# Patient Record
Sex: Female | Born: 1940 | Race: White | Hispanic: No | Marital: Married | Smoking: Never smoker
Health system: Southern US, Community
[De-identification: ages and names within clinical notes are randomized; demographics above are authoritative.]

## PROBLEM LIST (undated history)

## (undated) DIAGNOSIS — E785 Hyperlipidemia, unspecified: Secondary | ICD-10-CM

## (undated) DIAGNOSIS — I10 Essential (primary) hypertension: Secondary | ICD-10-CM

## (undated) DIAGNOSIS — E669 Obesity, unspecified: Secondary | ICD-10-CM

## (undated) DIAGNOSIS — E1169 Type 2 diabetes mellitus with other specified complication: Secondary | ICD-10-CM

## (undated) DIAGNOSIS — Z6841 Body Mass Index (BMI) 40.0 and over, adult: Secondary | ICD-10-CM

## (undated) DIAGNOSIS — E039 Hypothyroidism, unspecified: Secondary | ICD-10-CM

## (undated) DIAGNOSIS — K760 Fatty (change of) liver, not elsewhere classified: Secondary | ICD-10-CM

## (undated) DIAGNOSIS — M3500 Sicca syndrome, unspecified: Secondary | ICD-10-CM

## (undated) DIAGNOSIS — G629 Polyneuropathy, unspecified: Secondary | ICD-10-CM

## (undated) DIAGNOSIS — M722 Plantar fascial fibromatosis: Secondary | ICD-10-CM

## (undated) HISTORY — DX: Hyperlipidemia, unspecified: E78.5

## (undated) HISTORY — DX: Sjogren syndrome, unspecified: M35.00

## (undated) HISTORY — PX: OTHER SURGICAL HISTORY: SHX169

## (undated) HISTORY — DX: Polyneuropathy, unspecified: G62.9

## (undated) HISTORY — DX: Type 2 diabetes mellitus with other specified complication: E11.69

## (undated) HISTORY — DX: Plantar fascial fibromatosis: M72.2

## (undated) HISTORY — DX: Hypothyroidism, unspecified: E03.9

## (undated) HISTORY — DX: Morbid (severe) obesity due to excess calories: E66.01

## (undated) HISTORY — DX: Obesity, unspecified: E66.9

## (undated) HISTORY — DX: Essential (primary) hypertension: I10

## (undated) HISTORY — PX: ABDOMINAL HYSTERECTOMY: SHX81

## (undated) HISTORY — DX: Body Mass Index (BMI) 40.0 and over, adult: Z684

---

## 1998-03-20 ENCOUNTER — Ambulatory Visit (HOSPITAL_COMMUNITY): Admission: RE | Admit: 1998-03-20 | Discharge: 1998-03-20 | Payer: Self-pay | Admitting: Cardiology

## 1998-03-30 ENCOUNTER — Ambulatory Visit (HOSPITAL_COMMUNITY): Admission: RE | Admit: 1998-03-30 | Discharge: 1998-03-30 | Payer: Self-pay | Admitting: Cardiology

## 1999-05-11 ENCOUNTER — Ambulatory Visit (HOSPITAL_COMMUNITY): Admission: RE | Admit: 1999-05-11 | Discharge: 1999-05-11 | Payer: Self-pay | Admitting: Gastroenterology

## 2000-05-21 ENCOUNTER — Emergency Department (HOSPITAL_COMMUNITY): Admission: EM | Admit: 2000-05-21 | Discharge: 2000-05-21 | Payer: Self-pay | Admitting: Emergency Medicine

## 2000-05-21 ENCOUNTER — Encounter: Payer: Self-pay | Admitting: *Deleted

## 2000-06-12 ENCOUNTER — Encounter: Admission: RE | Admit: 2000-06-12 | Discharge: 2000-06-12 | Payer: Self-pay | Admitting: Gastroenterology

## 2000-06-12 ENCOUNTER — Encounter: Payer: Self-pay | Admitting: Gastroenterology

## 2000-07-25 ENCOUNTER — Encounter: Payer: Self-pay | Admitting: Gastroenterology

## 2000-07-25 ENCOUNTER — Ambulatory Visit (HOSPITAL_COMMUNITY): Admission: RE | Admit: 2000-07-25 | Discharge: 2000-07-25 | Payer: Self-pay | Admitting: Gastroenterology

## 2000-08-02 ENCOUNTER — Other Ambulatory Visit: Admission: RE | Admit: 2000-08-02 | Discharge: 2000-08-02 | Payer: Self-pay | Admitting: *Deleted

## 2000-08-15 ENCOUNTER — Emergency Department (HOSPITAL_COMMUNITY): Admission: EM | Admit: 2000-08-15 | Discharge: 2000-08-15 | Payer: Self-pay

## 2000-10-03 ENCOUNTER — Ambulatory Visit (HOSPITAL_COMMUNITY): Admission: RE | Admit: 2000-10-03 | Discharge: 2000-10-03 | Payer: Self-pay | Admitting: Gastroenterology

## 2000-10-03 ENCOUNTER — Encounter (INDEPENDENT_AMBULATORY_CARE_PROVIDER_SITE_OTHER): Payer: Self-pay | Admitting: Specialist

## 2001-05-02 ENCOUNTER — Encounter: Payer: Self-pay | Admitting: Endocrinology

## 2001-05-02 ENCOUNTER — Ambulatory Visit (HOSPITAL_COMMUNITY): Admission: RE | Admit: 2001-05-02 | Discharge: 2001-05-02 | Payer: Self-pay | Admitting: Endocrinology

## 2001-10-16 ENCOUNTER — Ambulatory Visit (HOSPITAL_COMMUNITY): Admission: RE | Admit: 2001-10-16 | Discharge: 2001-10-16 | Payer: Self-pay | Admitting: Endocrinology

## 2001-10-16 ENCOUNTER — Encounter: Payer: Self-pay | Admitting: Endocrinology

## 2002-10-25 ENCOUNTER — Encounter: Payer: Self-pay | Admitting: Obstetrics and Gynecology

## 2002-10-25 ENCOUNTER — Encounter: Admission: RE | Admit: 2002-10-25 | Discharge: 2002-10-25 | Payer: Self-pay | Admitting: Obstetrics and Gynecology

## 2003-03-04 ENCOUNTER — Ambulatory Visit (HOSPITAL_COMMUNITY): Admission: RE | Admit: 2003-03-04 | Discharge: 2003-03-04 | Payer: Self-pay | Admitting: Endocrinology

## 2003-03-04 ENCOUNTER — Encounter: Payer: Self-pay | Admitting: Endocrinology

## 2004-02-11 ENCOUNTER — Encounter (INDEPENDENT_AMBULATORY_CARE_PROVIDER_SITE_OTHER): Payer: Self-pay | Admitting: Specialist

## 2004-02-11 ENCOUNTER — Ambulatory Visit (HOSPITAL_COMMUNITY): Admission: RE | Admit: 2004-02-11 | Discharge: 2004-02-11 | Payer: Self-pay | Admitting: Gastroenterology

## 2004-06-30 ENCOUNTER — Encounter: Admission: RE | Admit: 2004-06-30 | Discharge: 2004-06-30 | Payer: Self-pay | Admitting: Surgery

## 2005-10-12 ENCOUNTER — Encounter: Payer: Self-pay | Admitting: Cardiology

## 2007-06-13 ENCOUNTER — Encounter: Admission: RE | Admit: 2007-06-13 | Discharge: 2007-06-13 | Payer: Self-pay | Admitting: Obstetrics & Gynecology

## 2009-11-11 ENCOUNTER — Encounter: Admission: RE | Admit: 2009-11-11 | Discharge: 2009-11-11 | Payer: Self-pay | Admitting: Obstetrics and Gynecology

## 2010-01-04 ENCOUNTER — Encounter: Admission: RE | Admit: 2010-01-04 | Discharge: 2010-03-11 | Payer: Self-pay | Admitting: Endocrinology

## 2010-07-04 ENCOUNTER — Encounter: Payer: Self-pay | Admitting: Obstetrics and Gynecology

## 2010-09-15 ENCOUNTER — Ambulatory Visit
Admission: RE | Admit: 2010-09-15 | Discharge: 2010-09-15 | Disposition: A | Discharge status: Home / Self Care | Payer: Medicare Other | Source: Ambulatory Visit | Attending: Internal Medicine | Admitting: Internal Medicine

## 2010-09-15 ENCOUNTER — Other Ambulatory Visit: Payer: Self-pay | Admitting: Internal Medicine

## 2010-09-15 DIAGNOSIS — R600 Localized edema: Secondary | ICD-10-CM

## 2010-10-29 NOTE — Op Note (Signed)
NAME:  Rebecca Carey, Rebecca Carey                      ACCOUNT NO.:  [id number]   MEDICAL RECORD NO.:  [id number]                   PATIENT TYPE:  AMB   LOCATION:  ENDO                                 FACILITY:  [facility]   PHYSICIAN:  John C. Madilyn Fireman, M.D.                 DATE OF BIRTH:  1941-01-23   DATE OF PROCEDURE:  02/11/2004  DATE OF DISCHARGE:                                 OPERATIVE REPORT   PROCEDURE:  Colonoscopy.   INDICATION FOR PROCEDURE:  Family history of colon polyps with last  colonoscopy in 2000.  She has also had a three-month history of diarrhea and  occasional rectal bleeding.   PROCEDURE:  The patient was placed in the left lateral decubitus position  and placed on the pulse monitor with continuous low-flow oxygen delivered  via nasal cannula.  She was sedated with 62.5 mcg IV fentanyl and 8 mg IV  Versed.  The Olympus video colonoscope was inserted into the rectum and  advanced to the cecum, confirmed by transillumination of McBurney's point  and visualization of the ileocecal valve and appendiceal orifice.  The prep  was excellent.  The terminal ileum was intubated, explored for several  centimeters, and appeared to be within normal limits.  The cecum, ascending,  transverse, descending, and sigmoid colon all appeared normal with no  masses, polyps, diverticula, or other mucosal abnormalities.  The rectum  likewise appeared normal with the exception of some small to moderate  internal hemorrhoids seen on retroflexed view.  Biopsies were taken to rule  out microscopic colitis.  The scope was then withdrawn and the patient  returned to the recovery room in stable condition.  She tolerated the  procedure well, and there were no immediate complications.   IMPRESSION:  Internal hemorrhoids, otherwise normal study.   PLAN:  Await biopsy results in search of the cause of her diarrhea.  Will  consider serologic screening for celiac disease if biopsies negative  Follow-  up  colonoscopy in five years.                                               John C. Madilyn Fireman, M.D.    JCH/MEDQ  D:  02/11/2004  T:  02/12/2004  Job:  [id number]   cc:   Jeannett Senior A. Evlyn Kanner, M.D.  [address]  [place]  [place] [address]  Fax: [phone]

## 2010-11-02 NOTE — Op Note (Signed)
[place]  Patient:    Rebecca Carey, Rebecca Carey                   MRN: [id number] Proc. Date: 10/03/00 Adm. Date:  [id number] Attending:  Louie Bun CC:         Anselm Pancoast. Zachery Dakins, M.D.   Operative Report  PROCEDURE:  Esophagogastroduodenoscopy with biopsy.  INDICATIONS:  Noncardiac chest and epigastric abdominal pain with failure to respond adequately to Nexium and minimal response to Levbid.  The patient has requested consultation with surgeon for possible biliary etiology, although, her Pipida scan and ultrasound have been negative.  DESCRIPTION OF PROCEDURE:  The patient was placed in the left lateral decubitus position and placed on the pulse monitor with continuous low flow oxygen delivered by nasal cannula.  She was sedated with 50 mg of IV Demerol and 5 mg of IV Versed.  The olympus video endoscope was advanced under direct vision into the oropharynx and esophagus.  The esophagus was straight and of normal caliber with squamocolumnar line at 36 cm.  The GE junction was quite patulous and free reflux was noted during the procedure.  There appeared to be a short 1 to 2 cm hiatal hernia and when the LES was fully relaxed, a widely patent ring was seen.  In addition, there was a small granular-appearing nodule 1 or 2 cm distal to the squamocolumnar line.  This was biopsied, but accurate biopsing was difficult due to the patients inability to hold air and some wretching during the procedure.  Initial biopsies resulted in bleeding which obscured the view and may have limited the accuracy of the biopsies. The stomach was entered and a small amount of liquid secretions were suctioned from the fundus.  Retroflexed view of the cardia was unremarkable.  The fundus, body, antrum, and pyloris all appeared normal.  The duodenum was entered and both the bulb and second portions were well inspected and appeared to be within normal limits.   Endoscope was then withdrawn and the patient returned to the recovery room in stable condition.  She tolerated the procedure well and there were no immediate complications.  IMPRESSION: 1. Hiatal hernia with patulous gastroesophageal junction and evidence of    gastroesophageal reflux. 2. Widely patent lower esophageal ring. 3. Small gastroesophageal junction nodule, biopsied.  PLAN:  Await biopsy results and will reinitiate Nexium at b.i.d. dosing while continuing Levbid.  Follow up in the office in a few weeks to assess response and to review biopsy results. DD:  10/03/00 TD:  10/04/00 Job: [id number]

## 2011-05-27 ENCOUNTER — Other Ambulatory Visit: Payer: Self-pay | Admitting: Nurse Practitioner

## 2011-05-27 DIAGNOSIS — Z1231 Encounter for screening mammogram for malignant neoplasm of breast: Secondary | ICD-10-CM

## 2011-06-29 ENCOUNTER — Ambulatory Visit
Admission: RE | Admit: 2011-06-29 | Discharge: 2011-06-29 | Disposition: A | Discharge status: Home / Self Care | Payer: Medicare Other | Source: Ambulatory Visit | Attending: Nurse Practitioner | Admitting: Nurse Practitioner

## 2011-06-29 DIAGNOSIS — Z1231 Encounter for screening mammogram for malignant neoplasm of breast: Secondary | ICD-10-CM

## 2012-09-14 ENCOUNTER — Other Ambulatory Visit (HOSPITAL_COMMUNITY): Payer: Self-pay | Admitting: Cardiology

## 2012-09-14 DIAGNOSIS — R0602 Shortness of breath: Secondary | ICD-10-CM

## 2012-09-14 DIAGNOSIS — R079 Chest pain, unspecified: Secondary | ICD-10-CM

## 2012-09-20 ENCOUNTER — Ambulatory Visit (HOSPITAL_COMMUNITY)
Admission: RE | Admit: 2012-09-20 | Discharge: 2012-09-20 | Disposition: A | Discharge status: Home / Self Care | Payer: Medicare Other | Source: Ambulatory Visit | Attending: Cardiology | Admitting: Cardiology

## 2012-09-20 ENCOUNTER — Encounter (HOSPITAL_COMMUNITY): Payer: Medicare Other

## 2012-09-20 DIAGNOSIS — R0989 Other specified symptoms and signs involving the circulatory and respiratory systems: Secondary | ICD-10-CM | POA: Insufficient documentation

## 2012-09-20 DIAGNOSIS — R0609 Other forms of dyspnea: Secondary | ICD-10-CM | POA: Insufficient documentation

## 2012-09-20 DIAGNOSIS — R0602 Shortness of breath: Secondary | ICD-10-CM

## 2012-09-20 DIAGNOSIS — I10 Essential (primary) hypertension: Secondary | ICD-10-CM | POA: Insufficient documentation

## 2012-09-20 DIAGNOSIS — E119 Type 2 diabetes mellitus without complications: Secondary | ICD-10-CM | POA: Insufficient documentation

## 2012-09-20 DIAGNOSIS — E669 Obesity, unspecified: Secondary | ICD-10-CM | POA: Insufficient documentation

## 2012-09-20 DIAGNOSIS — R079 Chest pain, unspecified: Secondary | ICD-10-CM

## 2012-09-20 DIAGNOSIS — I252 Old myocardial infarction: Secondary | ICD-10-CM | POA: Insufficient documentation

## 2012-09-20 HISTORY — PX: TRANSTHORACIC ECHOCARDIOGRAM: SHX275

## 2012-09-20 NOTE — Progress Notes (Signed)
[facility]   2D echo completed 09/20/2012.   Cindy Jemmie Rhinehart, RDCS  

## 2012-09-21 ENCOUNTER — Encounter (HOSPITAL_COMMUNITY): Payer: Medicare Other

## 2012-09-21 ENCOUNTER — Ambulatory Visit (HOSPITAL_COMMUNITY): Payer: Medicare Other

## 2012-10-23 ENCOUNTER — Other Ambulatory Visit: Payer: Self-pay | Admitting: Internal Medicine

## 2012-10-23 DIAGNOSIS — Z1231 Encounter for screening mammogram for malignant neoplasm of breast: Secondary | ICD-10-CM

## 2012-11-06 ENCOUNTER — Ambulatory Visit (HOSPITAL_COMMUNITY)
Admission: RE | Admit: 2012-11-06 | Discharge: 2012-11-06 | Disposition: A | Discharge status: Home / Self Care | Payer: Medicare Other | Source: Ambulatory Visit | Attending: Cardiology | Admitting: Cardiology

## 2012-11-06 DIAGNOSIS — R0609 Other forms of dyspnea: Secondary | ICD-10-CM | POA: Insufficient documentation

## 2012-11-06 DIAGNOSIS — R5381 Other malaise: Secondary | ICD-10-CM | POA: Insufficient documentation

## 2012-11-06 DIAGNOSIS — R42 Dizziness and giddiness: Secondary | ICD-10-CM | POA: Insufficient documentation

## 2012-11-06 DIAGNOSIS — R5383 Other fatigue: Secondary | ICD-10-CM | POA: Insufficient documentation

## 2012-11-06 DIAGNOSIS — R0989 Other specified symptoms and signs involving the circulatory and respiratory systems: Secondary | ICD-10-CM | POA: Insufficient documentation

## 2012-11-06 DIAGNOSIS — R079 Chest pain, unspecified: Secondary | ICD-10-CM | POA: Insufficient documentation

## 2012-11-06 DIAGNOSIS — R002 Palpitations: Secondary | ICD-10-CM | POA: Insufficient documentation

## 2012-11-06 DIAGNOSIS — R0602 Shortness of breath: Secondary | ICD-10-CM | POA: Insufficient documentation

## 2012-11-06 HISTORY — PX: NM MYOVIEW LTD: HXRAD82

## 2012-11-06 MED ORDER — REGADENOSON 0.4 MG/5ML IV SOLN
0.4000 mg | Freq: Once | INTRAVENOUS | Status: AC
  Administered 2012-11-06: 0.4 mg via INTRAVENOUS

## 2012-11-06 MED ORDER — TECHNETIUM TC 99M SESTAMIBI GENERIC - CARDIOLITE
30.0000 | Freq: Once | INTRAVENOUS | Status: AC | PRN
  Administered 2012-11-06: 30 via INTRAVENOUS

## 2012-11-06 MED ORDER — TECHNETIUM TC 99M SESTAMIBI GENERIC - CARDIOLITE
10.0000 | Freq: Once | INTRAVENOUS | Status: AC | PRN
  Administered 2012-11-06: 10 via INTRAVENOUS

## 2012-11-06 MED ORDER — AMINOPHYLLINE 25 MG/ML IV SOLN
75.0000 mg | Freq: Once | INTRAVENOUS | Status: AC
  Administered 2012-11-06: 75 mg via INTRAVENOUS

## 2012-11-06 NOTE — Procedures (Addendum)
[facility] [address] [place] [address] [place] [address] [phone]  Cardiology Nuclear Med Study  Rebecca Carey is a 72 y.o. female     MRN : [id number]     DOB: 03-08-1941  Procedure Date: 11/06/2012  Nuclear Med Background Indication for Stress Test:  Evaluation for Ischemia History:  ptca--unknown year Cardiac Risk Factors: Family History - CAD, Hypertension, Lipids, NIDDM and Obesity  Symptoms:  Chest Pain, Dizziness, Fatigue, Light-Headedness, Palpitations and SOB   Nuclear Pre-Procedure Caffeine/Decaff Intake:  10:00pm NPO After: 8:00am   IV Site: R Hand  IV 0.9% NS with Angio Cath:  22g  Chest Size (in):  N/A IV Started by: Emmit Pomfret, RN  Height: 5\' 2"  (1.575 m)  Cup Size: n/a  BMI:  Body mass index is 44.25 kg/(m^2). Weight:  242 lb (109.77 kg)   Tech Comments:  N/A    Nuclear Med Study 1 or 2 day study: 1 day  Stress Test Type:  Lexiscan  Order Authorizing Provider:  Bryan Lemma, MD   Resting Radionuclide: Technetium 32m Sestamibi  Resting Radionuclide Dose: 10.2 mCi   Stress Radionuclide:  Technetium 52m Sestamibi  Stress Radionuclide Dose: 31.0 mCi           Stress Protocol Rest HR: 58 Stress HR: 81  Rest BP: 139/77 Stress BP: 139/99  Exercise Time (min): n/a METS: n/a   Predicted Max HR: 151 bpm % Max HR: 53.64 bpm Rate Pressure Product: 21308  Dose of Adenosine (mg):  n/a Dose of Lexiscan: 0.4 mg  Dose of Atropine (mg): n/a Dose of Dobutamine: n/a mcg/kg/min (at max HR)  Stress Test Technologist: Esperanza Sheets, CCT Nuclear Technologist: Koren Shiver, CNMT   Rest Procedure:  Myocardial perfusion imaging was performed at rest 45 minutes following the intravenous administration of Technetium 22m Sestamibi. Stress Procedure:  The patient received IV Lexiscan 0.4 mg over 15-seconds.  Technetium 49m Sestamibi injected at 30-seconds.  The Patient experienced SOB, stomach, arms and head  discomfort; 75 mg of IV Aminophylline was administered with resolution of symptoms.  There were no significant changes with Lexiscan.  Quantitative spect images were obtained after a 45 minute delay.  Transient Ischemic Dilatation (Normal <1.22):  0.97 Lung/Heart Ratio (Normal <0.45):  0.39 QGS EDV:  60 ml QGS ESV:  18 ml LV Ejection Fraction: 71%  Signed by Koren Shiver, CNMT  PHYSICIAN INTERPRETATION  Rest ECG: NSR - Normal EKG  Stress ECG: No significant change from baseline ECG and No significant ST segment change suggestive of ischemia.  QPS Raw Data Images:  There is a breast shadow that accounts for the anterior attenuation. Stress Images:  There is decreased uptake in the apex.  There is decreased uptake in the anterior wall. Rest Images:  There is decreased uptake in the apex. There is decreased uptake in the anterior wall.Comparison with the stress images reveals no significant change. Subtraction (SDS):  There is a fixed anteriour defect that is most consistent with breast attenuation.  Impression Exercise Capacity:  Lexiscan with no exercise. BP Response:  Hypotensive blood pressure response. Clinical Symptoms:  There is dyspnea. ECG Impression:  No significant ECG changes with Lexiscan. Comparison with Prior Nuclear Study: No images to compare LV Wall Motion:  NL LV Function; NL Wall Motion  Overall Impression:  Normal stress nuclear study. and Low risk stress nuclear study with apical perfusion defect consistent with Breast Attenuation.Marland Kitchen   Marykay Lex, MD  11/06/2012 5:55 PM

## 2012-11-09 ENCOUNTER — Telehealth: Payer: Self-pay | Admitting: *Deleted

## 2012-11-09 NOTE — Telephone Encounter (Signed)
Message copied by Tobin Chad on Fri Nov 09, 2012  6:31 PM ------      Message from: [facility], DAVID      Created: Wed Nov 07, 2012  1:25 PM       Essentially normal nuclear stress test. No evidence of any heart artery blockages or prior heart attack. Normal function.            Marykay Lex, MD       ------

## 2012-11-09 NOTE — Telephone Encounter (Signed)
Spoke to patient . Results given about  Her Myoview. She stated that she has chest discomfort most of the day. Center her chest thorough to her back.She states she has had som type of discomfort since Feb 2014.  She states she has only took aspirin  for discomfort . Instruction given to patient to go to ER for distress. She states she has an appointment 6/24. Do not wait for appt if you have discomfort go to ER.

## 2012-11-27 ENCOUNTER — Ambulatory Visit
Admission: RE | Admit: 2012-11-27 | Discharge: 2012-11-27 | Disposition: A | Discharge status: Home / Self Care | Payer: Medicare Other | Source: Ambulatory Visit | Attending: Internal Medicine | Admitting: Internal Medicine

## 2012-11-27 DIAGNOSIS — Z1231 Encounter for screening mammogram for malignant neoplasm of breast: Secondary | ICD-10-CM

## 2012-12-04 ENCOUNTER — Ambulatory Visit (INDEPENDENT_AMBULATORY_CARE_PROVIDER_SITE_OTHER): Payer: Medicare Other | Admitting: Cardiology

## 2012-12-04 ENCOUNTER — Encounter: Payer: Self-pay | Admitting: Cardiology

## 2012-12-04 VITALS — BP 150/80 | HR 68 | Ht 62.0 in | Wt 237.7 lb

## 2012-12-04 DIAGNOSIS — E669 Obesity, unspecified: Secondary | ICD-10-CM

## 2012-12-04 DIAGNOSIS — E785 Hyperlipidemia, unspecified: Secondary | ICD-10-CM

## 2012-12-04 DIAGNOSIS — Z79899 Other long term (current) drug therapy: Secondary | ICD-10-CM

## 2012-12-04 DIAGNOSIS — R0789 Other chest pain: Secondary | ICD-10-CM

## 2012-12-04 DIAGNOSIS — E039 Hypothyroidism, unspecified: Secondary | ICD-10-CM

## 2012-12-04 DIAGNOSIS — Z6841 Body Mass Index (BMI) 40.0 and over, adult: Secondary | ICD-10-CM

## 2012-12-04 DIAGNOSIS — E1169 Type 2 diabetes mellitus with other specified complication: Secondary | ICD-10-CM

## 2012-12-04 DIAGNOSIS — I10 Essential (primary) hypertension: Secondary | ICD-10-CM

## 2012-12-04 DIAGNOSIS — E119 Type 2 diabetes mellitus without complications: Secondary | ICD-10-CM

## 2012-12-04 DIAGNOSIS — R600 Localized edema: Secondary | ICD-10-CM

## 2012-12-04 DIAGNOSIS — R609 Edema, unspecified: Secondary | ICD-10-CM

## 2012-12-04 MED ORDER — FUROSEMIDE 20 MG PO TABS: 20.0000 mg | ORAL_TABLET | Freq: Every day | ORAL | Status: DC

## 2012-12-04 MED ORDER — LOSARTAN POTASSIUM 50 MG PO TABS: 50.0000 mg | ORAL_TABLET | Freq: Every day | ORAL | Status: DC

## 2012-12-04 NOTE — Patient Instructions (Addendum)
Your physician has recommended you make the following change in your medication:  Start taking Losartan (Cozaar) 50mg  daily.  Complete HCTZ bottle, then begin taking Furosemide 20mg  daily.   Your physician recommends that you return for lab work in: 2 months BMET  Your physician wants you to follow-up in: 3 months. You will receive a reminder letter in the mail two months in advance. If you don't receive a letter, please call our office to schedule the follow-up appointment.

## 2012-12-09 ENCOUNTER — Encounter: Payer: Self-pay | Admitting: Cardiology

## 2012-12-09 DIAGNOSIS — E1169 Type 2 diabetes mellitus with other specified complication: Secondary | ICD-10-CM | POA: Insufficient documentation

## 2012-12-09 DIAGNOSIS — I10 Essential (primary) hypertension: Secondary | ICD-10-CM | POA: Insufficient documentation

## 2012-12-09 DIAGNOSIS — R600 Localized edema: Secondary | ICD-10-CM | POA: Insufficient documentation

## 2012-12-09 DIAGNOSIS — E039 Hypothyroidism, unspecified: Secondary | ICD-10-CM | POA: Insufficient documentation

## 2012-12-09 DIAGNOSIS — E785 Hyperlipidemia, unspecified: Secondary | ICD-10-CM | POA: Insufficient documentation

## 2012-12-09 NOTE — Progress Notes (Signed)
Patient ID: Rebecca Carey, female   DOB: 06-05-1941, 72 y.o.   MRN: [id number]  Clinic Note: HPI: Rebecca Carey is a 72 y.o. female with a PMH below who presents today for followup of her LexiScan Myoview. I saw the second and may with an atypical chest pain as well as dizziness and lightheadedness of palpitations. This is in the setting of a lot of cysts of social stressors. She had mild edema nothing significant. Her symptoms were evaluated with a LexiScan Myoview was negative for ischemia. He also had an echocardiogram to confirm normal ejection fraction, no regional wall motion around his. I also started her on losartan 25 mg daily and put her back on HCTZ. The main reason for missing her was more edema and poorly controlled blood pressure..  Interval History: Since his last visit he says he is overall feeling better in the chest he still has lightheadedness and this feels like his calf on. He mostly notices the palpitations when he has a higher stress levels. She does walk herself daily but walking her dog. She's lost 5 pounds since her last visit. He still has a little discomfort in his chest 27 the palpitations but otherwise not really significant discomfort. She denies any true dyspnea on exertion, unless she is really pushing it.  She still notes palpitations, as well as mild bilateral edema. She notes her chest discomfort has improved, she still notes some symptoms of hot flashes as well as lightheadedness.. She sleeps with the a wedge which would be like 3 pillow orthopnea. She still has mild edema bilaterally.  Overall for cardiac standpoint, she still does note some intermittent palpitations along with baseline shortness of breath. She denies any further chest discomfort but does have mild lightheadedness, tinnitus, and bloating. Overall, provided she didn't reduce her level of stress of this dictation ON this. Her blood rate controlled be much improved.   The remainder of  cardiac review of systems is as follows: Cardiovascular ROS: positive for - dyspnea on exertion and shortness of breath negative for - edema, irregular heartbeat, loss of consciousness, murmur, orthopnea or paroxysmal nocturnal dyspnea :   Past Medical History  Diagnosis Date  . Morbid obesity with BMI of 45.0-49.9, adult     BMI ~45; 5'2" 242 lb  . HTN (hypertension)   . 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 Dyslipidemia   . Hypothyroidism    Prior Cardiac Evaluation and Past Surgical History: Past Surgical History  Procedure Laterality Date  . Doppler echocardiography  09/20/2012    normal LV size and function with an EF of    Allergies  Allergen Reactions  . Codeine Other (See Comments)    unspecified  . Cortisone Other (See Comments)    Unspecified.   . Sulfa Antibiotics Other (See Comments)    Unspecified.     Current Outpatient Prescriptions  Medication Sig Dispense Refill  . ALPHA LIPOIC ACID PO Take 1 tablet by mouth 2 (two) times daily.      . Ascorbic Acid (VITAMIN C PO) Take 1 tablet by mouth daily.      . B Complex Vitamins (B COMPLEX PO) Place 1 tablet under the tongue daily.      Marland Kitchen BIOTIN PO Take 1 tablet by mouth daily.      . CHROMIUM PO Take 1 tablet by mouth daily.      . Coenzyme Q10 (CO Q-10) 100 MG CAPS Take  100 mg by mouth 2 (two) times daily.      Forestine Na Leaf POWD Take 1 capsule by mouth daily.      . hydrochlorothiazide (MICROZIDE) 12.5 MG capsule Take 12.5 mg by mouth daily.      . Multiple Vitamins-Minerals (MULTIVITAMIN PO) Take 1 tablet by mouth daily.      . Omega-3 Fatty Acids (FISH OIL PO) Take 1 capsule by mouth daily.      Marland Kitchen OVER THE COUNTER MEDICATION Take 1 each by mouth at bedtime. CAL-MAX (calcium-magnesium-vit C)      . propranolol (INDERAL) 20 MG tablet Take 20 mg by mouth 2 (two) times daily.      Marland Kitchen SYNTHROID 125 MCG tablet Take 125 mcg by mouth daily.      Marland Kitchen VANADYL SULFATE PO Take 1  tablet by mouth 2 (two) times daily.      . furosemide (LASIX) 20 MG tablet Take 1 tablet (20 mg total) by mouth daily.  30 tablet  11  . losartan (COZAAR) 50 MG tablet Take 1 tablet (50 mg total) by mouth daily.  30 tablet  11   No current facility-administered medications for this visit.    History   Social History  . Marital Status: Married    Spouse Name: N/A    Number of Children: N/A  . Years of Education: N/A   Occupational History  . Not on file.   Social History Main Topics  . Smoking status: Never Smoker   . Smokeless tobacco: Never Used  . Alcohol Use: No  . Drug Use: No  . Sexually Active: Not on file   Other Topics Concern  . Not on file   Social History Narrative   She is a married mother of 1. She tries to walk every day, does not smoke and does not drink.      She has a significant family history coronary disease.    ROS: A comprehensive Review of Systems - Negative except Pertinent positives and negatives above  PHYSICAL EXAM BP 150/80  Pulse 68  Ht 5\' 2"  (1.575 m)  Wt 237 lb 11.2 oz (107.82 kg)  BMI 43.46 kg/m2 General: She is a very pleasant, very nervous appearing, morbidly obese woman. She is in no acute distress, alert and oriented x3, answers questions appropriately. She is well-groomed. HEENT: NCAT, EOMI, MMM and anicteric sclerae.  Neck: Supple, no LAN, JVD or carotid bruit. Normal carotid upstrokes.  Lungs: CTAB, nonlabored, normal effort, good air movement.  Heart: RRR, normal S1, S2, no M/R/G. Nondisplaced PMI.  Abdomen: Obese but otherwise soft/NT/ND/NABS. No HSM.  Extremities: No C/C/E, 2+ and equal pulses throughout. Mild puffiness, ~1+ nonpitting edema in the lower extremities and ankles and elbows.  UJW:[id number] today: No   Your physician wants you to follow-up in: 3   ASSESSMENT:  Overall improved. She's had a pretty good cardiac evaluation does show that her symptoms are probably not cardiac in nature.  Patient Active  Problem List   Diagnosis Date Noted  . Morbid obesity with BMI of 45.0-49.9, adult     Priority: High  . HTN (hypertension)     Priority: Medium  . Dyslipidemia     Priority: Medium  . Diabetes mellitus type 2 in obese     Priority: Medium  . Hypothyroidism     Priority: Low  . Edema of both legs 12/09/2012     PLAN:Start Taking Losartan (Cozaar) 50 Mg Daily; Complete HCTZ Bottle, Then Begin  Taking Furosemide 20 Mg Daily.  Your Physician Recommends That You Return to work  This coming Monday. With the medication change, we will check Lab Work in: 2 Months (BMET)   Per problem list. Orders Placed This Encounter  Procedures  . Basic Metabolic Panel (BMET)    2 months  Followup: 3 months;  Waseem Suess W, M.D., M.S. [facility] [address] [place], [place]  [address]  [phone] Pager # [phone] 12/09/2012 10:03 PM

## 2012-12-11 DIAGNOSIS — R079 Chest pain, unspecified: Secondary | ICD-10-CM | POA: Insufficient documentation

## 2012-12-11 NOTE — Assessment & Plan Note (Addendum)
Overall her symptoms seem to be improved as far as chest discomfort lightheadedness and palpitations. She was relieved to here that her stress test was normal. Overall seemed to have improved. As it is associated with palpitations, next follow be to potentially titrate up her Inderal as her beta blocker. A portion is she's had issues with this in the past.

## 2012-12-11 NOTE — Assessment & Plan Note (Signed)
I believe this is being followed by her primary physician.

## 2012-12-11 NOTE — Assessment & Plan Note (Signed)
When she gets her next lab check we'll do surveillance he is.

## 2012-12-11 NOTE — Assessment & Plan Note (Signed)
She lost some weight since her last visit. She still needs to continue to lose weight. We discussed the importance of dietary modification as well as exercise.

## 2012-12-11 NOTE — Assessment & Plan Note (Addendum)
Not as adequately controlled as expected. She is on beta blocker, albeit Inderal as well as an ARB. As the Inderal is kind of standing medicine, not inclined to adjust to much. Continue to follow. May require adjustment of her beta blocker dose.  Since edema is become an issue for her, we'll then switch her from HCTZ to Lasix. She'll continue HCTZ the next couple days and then will switch over to Lasix. Also can increase her Cozaar to 50 mg daily. Will recheck labs in a couple months and see her back after that. In roughly 3 months.

## 2012-12-11 NOTE — Assessment & Plan Note (Signed)
Switched from HCTZ to Lasix. She'll continue next couple days of the HCTZ then stop it and restart her on 20 mg of Lasix daily.

## 2013-01-30 LAB — BASIC METABOLIC PANEL
BUN: 13 mg/dL (ref 6–23)
Creat: 0.57 mg/dL (ref 0.50–1.10)
Glucose, Bld: 102 mg/dL — ABNORMAL HIGH (ref 70–99)
Potassium: 4.4 mEq/L (ref 3.5–5.3)

## 2013-01-31 ENCOUNTER — Telehealth: Payer: Self-pay | Admitting: *Deleted

## 2013-01-31 NOTE — Telephone Encounter (Signed)
Message copied by Tobin Chad on Thu Jan 31, 2013  4:19 PM ------      Message from: [facility], DAVID      Created: Wed Jan 30, 2013 10:24 PM       Labs essentially looked pretty good but the glucose level is in the borderline zone for pre-diabetes. We'll need to monitor this. ------

## 2013-01-31 NOTE — Telephone Encounter (Signed)
Result given. Verbalized understanding.

## 2013-02-27 ENCOUNTER — Encounter: Payer: Self-pay | Admitting: *Deleted

## 2013-03-08 ENCOUNTER — Encounter: Payer: Self-pay | Admitting: Cardiology

## 2013-03-08 ENCOUNTER — Ambulatory Visit (INDEPENDENT_AMBULATORY_CARE_PROVIDER_SITE_OTHER): Payer: Medicare Other | Admitting: Cardiology

## 2013-03-08 VITALS — BP 130/92 | HR 62 | Ht 62.0 in | Wt 240.1 lb

## 2013-03-08 DIAGNOSIS — I10 Essential (primary) hypertension: Secondary | ICD-10-CM

## 2013-03-08 DIAGNOSIS — R609 Edema, unspecified: Secondary | ICD-10-CM

## 2013-03-08 DIAGNOSIS — Z6841 Body Mass Index (BMI) 40.0 and over, adult: Secondary | ICD-10-CM

## 2013-03-08 DIAGNOSIS — E669 Obesity, unspecified: Secondary | ICD-10-CM

## 2013-03-08 DIAGNOSIS — E1169 Type 2 diabetes mellitus with other specified complication: Secondary | ICD-10-CM

## 2013-03-08 DIAGNOSIS — R600 Localized edema: Secondary | ICD-10-CM

## 2013-03-08 DIAGNOSIS — I119 Hypertensive heart disease without heart failure: Secondary | ICD-10-CM

## 2013-03-08 DIAGNOSIS — E119 Type 2 diabetes mellitus without complications: Secondary | ICD-10-CM

## 2013-03-08 DIAGNOSIS — E785 Hyperlipidemia, unspecified: Secondary | ICD-10-CM

## 2013-03-08 DIAGNOSIS — R0789 Other chest pain: Secondary | ICD-10-CM

## 2013-03-08 MED ORDER — LOSARTAN POTASSIUM 25 MG PO TABS: 25.0000 mg | ORAL_TABLET | Freq: Every day | ORAL | Status: DC

## 2013-03-08 NOTE — Patient Instructions (Addendum)
I am glad things are doing better overall. Since you're still having with her that discomfort, would start by trying to get you back to Cozaar.  First try doing it by splitting the dose in half taking 1 in the morning and one in the evening.  With the Lasix, you don't have thickened every day if you can be out all afternoon don't take it.  I'm not certain what the tinnitus is coming from, I would recommend seeing an ENT doctor.  Marykay Lex, MD   Your physician wants you to follow-up in: 6 You will receive a reminder letter in the mail two months in advance. If you don't receive a letter, please call our office to schedule the follow-up appointment.

## 2013-03-13 ENCOUNTER — Encounter: Payer: Self-pay | Admitting: Cardiology

## 2013-03-13 NOTE — Assessment & Plan Note (Signed)
Her chest discomfort seems to improve.  A PICC is maybe more related to palpitations.  Thankfully her stress test was normal as was her echocardiogram.  With the increased dose of Inderal, her discomfort has gotten much better.

## 2013-03-13 NOTE — Assessment & Plan Note (Signed)
Much better on Lasix.  Palpation he states every day.  She does take it more like several days a week and as needed.

## 2013-03-13 NOTE — Assessment & Plan Note (Signed)
Followed by PCP.  I also don't know that she is on a medication other than the omega-3 fatty acids for treatment.  As far as her report they were relatively normal.  Her goal LDL the probably less than 1:30.

## 2013-03-13 NOTE — Progress Notes (Signed)
For a  Patient ID: Rebecca Carey, female   DOB: 06-16-1940, 72 y.o.   MRN: [id number]  Clinic Note: HPI: [name] is a 72 y.o. female with a PMH below who presents today for three-month followup.  Over the summer she has had chest pain evaluated with a LexiScan Myoview that demonstrated breast attenuation, she also had echocardiogram that was essentially normal.  I last saw her, my plan was to increase her losartan to 50 mg daily and switched from HCTZ to Lasix.  She was unable to tolerate the increased dose of losartan to 50.  She said that she felt a little dizzy.  She does not have edema is improved with the Lasix.  Interval History: As was the case in the last visit, she stills says that she feels "better each day.  The palpitations are better the dyspnea is better chest aching is also better. He is following along but it is still there.  The stress level is also a little bit decreased. She notes some mild arm puffiness.  The lightheadedness are off and on present.  The palpitations are notably improved, as a result her chest discomfort is also improved.    She denies any true dyspnea on exertion, unless she is really pushing it.  She still notes mild bilateral edema -- but more so in the hands. She still notes some symptoms of hot flashes as well as lightheadedness.. She sleeps with the a wedge which would be like 3 pillow orthopnea.   The remainder of cardiac review of systems is as follows: Cardiovascular ROS: positive for - dyspnea on exertion and palpitations negative for - chest pain, edema, irregular heartbeat, loss of consciousness, murmur, orthopnea, paroxysmal nocturnal dyspnea or rapid heart rate :  Past Medical History  Diagnosis Date  . Morbid obesity with BMI of 45.0-49.9, adult     BMI ~45; 5'2" 242 lb  . HTN (hypertension)   . 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 Dyslipidemia   . Hypothyroidism    Prior Cardiac Evaluation and  Past Surgical History: Past Surgical History  Procedure Laterality Date  . Doppler echocardiography  09/20/2012    normal LV size and function with an EF of  . Nm myoview ltd  11/06/2012    EF 71%.  No ischemia or infarction.  Anterior defect consistent with breast attenuation    Allergies  Allergen Reactions  . Codeine Other (See Comments)    unspecified  . Cortisone Other (See Comments)    Unspecified.   . Sulfa Antibiotics Other (See Comments)    Unspecified.     Current Outpatient Prescriptions  Medication Sig Dispense Refill  . ALPHA LIPOIC ACID PO Take 1 tablet by mouth 2 (two) times daily.      . Ascorbic Acid (VITAMIN C PO) Take 1 tablet by mouth daily.      . B Complex Vitamins (B COMPLEX PO) Place 1 tablet under the tongue daily.      Marland Kitchen BIOTIN PO Take 1 tablet by mouth daily.      . CHROMIUM PO Take 1 tablet by mouth daily.      . Coenzyme Q10 (CO Q-10) 100 MG CAPS Take 100 mg by mouth 2 (two) times daily.      . furosemide (LASIX) 20 MG tablet Take 1 tablet (20 mg total) by mouth daily.  30 tablet  11  . Gymnema Sylvestris Leaf POWD Take 1 capsule  by mouth daily.      Marland Kitchen losartan (COZAAR) 25 MG tablet Take 1 tablet (25 mg total) by mouth daily.  50 tablet  6  . Multiple Vitamins-Minerals (MULTIVITAMIN PO) Take 1 tablet by mouth daily.      . Omega-3 Fatty Acids (FISH OIL PO) Take 1 capsule by mouth daily.      . ONE TOUCH ULTRA TEST test strip       . OVER THE COUNTER MEDICATION Take 1 each by mouth at bedtime. CAL-MAX (calcium-magnesium-vit C)      . propranolol (INDERAL) 20 MG tablet Take 20 mg by mouth 2 (two) times daily.      Marland Kitchen SYNTHROID 125 MCG tablet Take 125 mcg by mouth daily.      Marland Kitchen VANADYL SULFATE PO Take 1 tablet by mouth 2 (two) times daily.       No current facility-administered medications for this visit.   History   Social History Narrative   She is a married mother of 1. She tries to walk every day, does not smoke and does not drink.      She  has a significant family history coronary disease.   ROS: A comprehensive Review of Systems - Negative except Pertinent positives and negatives above  PHYSICAL EXAM BP 130/92  Pulse 62  Ht 5\' 2"  (1.575 m)  Wt 240 lb 1.6 oz (108.909 kg)  BMI 43.9 kg/m2 General: She is a very pleasant, very nervous appearing, morbidly obese woman. NAD, A&Ox3. She is well-groomed. HEENT: NCAT, EOMI, MMM and anicteric sclerae.  Neck: Supple, no LAN, JVD or carotid bruit. Normal carotid upstrokes.  Lungs: CTAB, nonlabored, normal effort, good air movement.  Heart: RRR, normal S1, S2, no M/R/G. Nondisplaced PMI.  Abdomen: Obese but otherwise soft/NT/ND/NABS. No HSM.  Extremities: No C/C/E, 2+ and equal pulses throughout. Mild puffiness, ~1+ nonpitting edema in the lower extremities and ankles and elbows.  ZOX:WRUEAVWUJ today: Yes; Heart Rate 62 beats that appear normally see  BMP August 2014: Essentially normal, potassium 4.4, sodium 136, BUN/creatinine 13/0.57, glucose 102  ASSESSMENT/PLAN  Overall continued improvement. She's had a pretty good cardiac evaluation does show that her symptoms are probably not cardiac in nature.  Recommend that she sees her ENT physician to evaluate her tinnitus.  Atypical chest pain, low risk of acute coronary syndrome. Her chest discomfort seems to improve.  A PICC is maybe more related to palpitations.  Thankfully her stress test was normal as was her echocardiogram.  With the increased dose of Inderal, her discomfort has gotten much better.  HTN (hypertension) Diastolic pressure still being high, I want to get a little more afterload reduction just in case there is something diastolic dysfunction associated with her symptoms.  Plan: Gradually increased to a total of 50 mg of losartan.  We may need to do sports gradually.  We'll increase to 25 twice a day first.  If he tolerates that then we may go to 50 mg once a day, to keep things easier.  Edema of both legs Much  better on Lasix.  Palpation he states every day.  She does take it more like several days a week and as needed.  Dyslipidemia Followed by PCP.  I also don't know that she is on a medication other than the omega-3 fatty acids for treatment.  As far as her report they were relatively normal.  Her goal LDL the probably less than 1:30.  Morbid obesity with BMI of 45.0-49.9, adult No real  weight loss.  Again I stressed the importance of dietary modification and exercise.  We talked about decreased fatty foods and carbohydrates.  Improved fruits and vegetables smaller portions of lean proteins.    Orders Placed This Encounter  Procedures  . EKG 12-Lead   Followup: 6 months;  Deyonna Fitzsimmons W, M.D., M.S. [facility] [address] [place], [place]  [address]  [phone] Pager # [phone] 03/13/2013 5:00 AM

## 2013-03-13 NOTE — Assessment & Plan Note (Signed)
Diastolic pressure still being high, I want to get a little more afterload reduction just in case there is something diastolic dysfunction associated with her symptoms.  Plan: Gradually increased to a total of 50 mg of losartan.  We may need to do sports gradually.  We'll increase to 25 twice a day first.  If he tolerates that then we may go to 50 mg once a day, to keep things easier.

## 2013-03-13 NOTE — Assessment & Plan Note (Signed)
No real weight loss.  Again I stressed the importance of dietary modification and exercise.  We talked about decreased fatty foods and carbohydrates.  Improved fruits and vegetables smaller portions of lean proteins.

## 2013-04-02 ENCOUNTER — Encounter: Payer: Self-pay | Admitting: Cardiology

## 2013-09-26 ENCOUNTER — Ambulatory Visit (INDEPENDENT_AMBULATORY_CARE_PROVIDER_SITE_OTHER): Payer: Medicare Other | Admitting: Cardiology

## 2013-09-26 VITALS — BP 148/90 | HR 68 | Ht 62.0 in | Wt 240.1 lb

## 2013-09-26 DIAGNOSIS — R079 Chest pain, unspecified: Secondary | ICD-10-CM

## 2013-09-26 DIAGNOSIS — E669 Obesity, unspecified: Secondary | ICD-10-CM

## 2013-09-26 DIAGNOSIS — E785 Hyperlipidemia, unspecified: Secondary | ICD-10-CM

## 2013-09-26 DIAGNOSIS — E119 Type 2 diabetes mellitus without complications: Secondary | ICD-10-CM

## 2013-09-26 DIAGNOSIS — E1169 Type 2 diabetes mellitus with other specified complication: Secondary | ICD-10-CM

## 2013-09-26 DIAGNOSIS — R600 Localized edema: Secondary | ICD-10-CM

## 2013-09-26 DIAGNOSIS — Z6841 Body Mass Index (BMI) 40.0 and over, adult: Secondary | ICD-10-CM

## 2013-09-26 DIAGNOSIS — R609 Edema, unspecified: Secondary | ICD-10-CM

## 2013-09-26 DIAGNOSIS — I10 Essential (primary) hypertension: Secondary | ICD-10-CM

## 2013-09-26 MED ORDER — ISOSORBIDE MONONITRATE ER 30 MG PO TB24
30.0000 mg | ORAL_TABLET | Freq: Every day | ORAL | Status: DC
Start: 2013-09-26 — End: 2016-03-16

## 2013-09-26 NOTE — Patient Instructions (Signed)
Your physician recommends that you schedule a follow-up appointment in: 6 months  Your physician has recommended you make the following change in your medication: Start Imdur(isosorbide 30 mg at bedtime also start Aspirin 81 mg, take Imdur 30 minutes after taking the 81 mg Aspirin

## 2013-09-29 ENCOUNTER — Encounter: Payer: Self-pay | Admitting: Cardiology

## 2013-09-29 NOTE — Assessment & Plan Note (Signed)
Dual LDL would be closer to 100, but less than 130. Not currently on medication beyond omega-3 fatty acids and CoQ 10

## 2013-09-29 NOTE — Assessment & Plan Note (Signed)
Her negative Myoview in argue against macrovascular disease, however cannot exclude microvascular disease in the setting of mild LVH. Add low-dose nitrate to decrease preload and afterload and potentially help microvascular ischemia. We'll also add low-dose aspirin.

## 2013-09-29 NOTE — Assessment & Plan Note (Signed)
Interestingly, she still remains 1 hypertensive but was unable to tolerate increased dose of Cozaar. We may have to simply tolerate mild to his hypertension. I am Imdur, but that should not a silly affect her blood pressure. She is also on Inderal which we could potentially consider increasing in the future, if heart rate would allow it.

## 2013-09-29 NOTE — Assessment & Plan Note (Signed)
Better on Lasix 

## 2013-09-29 NOTE — Progress Notes (Signed)
Patient ID: Emmit AlexandersDorothy S Craigo, female   DOB: 12/10/1940, 73 y.o.   MRN: [id number]  Clinic Note: HPI: Emmit AlexandersDorothy S Defreitas is a 73 y.o. female with a PMH below who presents today for a two-month followup I last saw her in October for followup of a nonischemic LexiScan Myoview that was used to evaluate chest discomfort and palpitations.. She asked they seem to be symptomatically improved and was happy to you results of her stress test. We did increase the dose of Inderal for palpitations as well as hypertension. She was not able tolerate the losartan increased. Stable edema on Lasix  Interval History: As is usually the case, she has ups and downs. She says she is doing "about the same she says she's been having a bit more chest discomfort over the past few weeks than before, but has also been under quite a bit of social stress.  She says he currently has discomfort that starts in her back and shoulder blades and radiates around the chest. She also occasionally has some palpitations are much more rare than they have been before. The chest discomfort is not associated with any particular activity, in fact she does not notice it with the walking that she been trying to do. She has baseline exertional dyspnea that has not really changed. The more she has been walking, the less she gets short of breath, which would suggest that this is related to deconditioning.  She was not able tolerate increased dose of losartan, so she is currently only taking 12.5mg . She is also a bit discouraged that she has not lost any weight with the level activity she is doing. She has however noted get her glucose under much better control with diet and exercise.  The remainder of cardiac review of systems is as follows: Cardiovascular ROS: positive for - dyspnea on exertion and shortness of breath negative for - edema, irregular heartbeat, loss of consciousness, murmur, orthopnea or paroxysmal nocturnal dyspnea; no TIA or  amaurosis fugax symptoms; no syncope/near-syncope; no melena, hematochezia, hematuria or epistaxis. No claudication  Past Medical History  Diagnosis Date  . Morbid obesity with BMI of 45.0-49.9, adult     BMI ~45; 5'2" 242 lb  . HTN (hypertension)   . 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 Dyslipidemia   . Hypothyroidism    Prior Cardiac Evaluation and Past Surgical History: Past Surgical History  Procedure Laterality Date  . Doppler echocardiography  09/20/2012    normal LV size and function with an EF of  . Nm myoview ltd  11/06/2012    EF 71%.  No ischemia or infarction.  Anterior defect consistent with breast attenuation   MEDICATIONS AND ALLERGIES reviewed in Epic; no change Social and Family History also reviewed in Epic. No change  ROS: A comprehensive Review of Systems - Negative except Pertinent positives and negatives above  PHYSICAL EXAM BP 148/90  Pulse 68  Ht 5\' 2"  (1.575 m)  Wt 240 lb 1.6 oz (108.909 kg)  BMI 43.90 kg/m2 General: She is a very pleasant, very nervous appearing, morbidly obese woman. She is in no acute distress, alert and oriented x3, answers questions appropriately. She is well-groomed. HEENT: NCAT, EOMI, MMM and anicteric sclerae.  Neck: Supple, no LAN, JVD or carotid bruit. Normal carotid upstrokes.  Lungs: CTAB, nonlabored, normal effort, good air movement.  Heart: RRR, normal S1, S2, no M/R/G. Nondisplaced PMI.  Abdomen: Obese but otherwise soft/NT/ND/NABS. No  HSM.  Extremities: No C/C/E, 2+ and equal pulses throughout. Mild puffiness, ~1+ nonpitting edema in the lower extremities and ankles and elbows.  ZOX:WRUEAVWUJEKG:Performed today: y Normal sinus rhythm, rate 68. Normal axis and intervals. Essentially normal EKG  ASSESSMENT:  Overall improved. She's had a pretty good cardiac evaluation does show that her symptoms are probably not cardiac in nature.  Chest pain with low risk for cardiac etiology Her negative Myoview in  argue against macrovascular disease, however cannot exclude microvascular disease in the setting of mild LVH. Add low-dose nitrate to decrease preload and afterload and potentially help microvascular ischemia. We'll also add low-dose aspirin.  Dyslipidemia Dual LDL would be closer to 100, but less than 130. Not currently on medication beyond omega-3 fatty acids and CoQ 10  Edema of both legs Better on Lasix.  Morbid obesity with BMI of 45.0-49.9, adult Despite or size, not really losing weight. Will likely probably need dietary modification. Continue to encourage her dietary changes and exercise. I would hope that eventually weight will come down. If she continues to have difficulty, would potentially consider referral for GOP.  HTN (hypertension) Interestingly, she still remains 1 hypertensive but was unable to tolerate increased dose of Cozaar. We may have to simply tolerate mild to his hypertension. I am Imdur, but that should not a silly affect her blood pressure. She is also on Inderal which we could potentially consider increasing in the future, if heart rate would allow it.    PLAN SUMMARY:  Add Imdur 30 mg daily for possible microvascular disease  Start each bedtime aspirin that she takes prior to Imdur  Per problem list. Orders Placed This Encounter  Procedures  . EKG 12-Lead    Followup: 6 months;  Marykay Lexavid W Harding, M.D., M.S. [facility] [address] [place], [place]  [id number]  [phone] Pager # [phone] 09/29/2013 5:40 PM

## 2013-09-29 NOTE — Assessment & Plan Note (Signed)
Despite or size, not really losing weight. Will likely probably need dietary modification. Continue to encourage her dietary changes and exercise. I would hope that eventually weight will come down. If she continues to have difficulty, would potentially consider referral for GOP.

## 2013-10-24 ENCOUNTER — Other Ambulatory Visit: Payer: Self-pay | Admitting: Internal Medicine

## 2013-10-24 DIAGNOSIS — R1011 Right upper quadrant pain: Secondary | ICD-10-CM

## 2013-10-29 ENCOUNTER — Ambulatory Visit
Admission: RE | Admit: 2013-10-29 | Discharge: 2013-10-29 | Disposition: A | Discharge status: Home / Self Care | Payer: Medicare Other | Source: Ambulatory Visit | Attending: Internal Medicine | Admitting: Internal Medicine

## 2013-10-29 DIAGNOSIS — R1011 Right upper quadrant pain: Secondary | ICD-10-CM

## 2013-12-02 ENCOUNTER — Other Ambulatory Visit: Payer: Self-pay

## 2013-12-02 MED ORDER — FUROSEMIDE 20 MG PO TABS: 20.0000 mg | ORAL_TABLET | Freq: Every day | ORAL | Status: DC

## 2013-12-02 NOTE — Telephone Encounter (Signed)
Rx was sent to pharmacy electronically. 

## 2013-12-18 ENCOUNTER — Other Ambulatory Visit: Payer: Self-pay | Admitting: Dermatology

## 2014-02-03 ENCOUNTER — Other Ambulatory Visit: Payer: Self-pay

## 2014-02-03 MED ORDER — LOSARTAN POTASSIUM 25 MG PO TABS: 25.0000 mg | ORAL_TABLET | Freq: Every day | ORAL | Status: DC

## 2014-02-03 NOTE — Telephone Encounter (Signed)
Rx was sent to pharmacy electronically. 

## 2014-02-03 NOTE — Telephone Encounter (Signed)
Spoke with patient to clarify dosage,  Patient takes  a day had been getting  tablets and cutting them in half. Patient also had questions about colonoscopy coming up in on 02/18/14. I spoke with Dr.Harding who said that after viewing last office note that he has no concerns with her going in for procedure.

## 2014-02-19 ENCOUNTER — Other Ambulatory Visit: Payer: Self-pay

## 2014-02-19 MED ORDER — FUROSEMIDE 20 MG PO TABS: 20.0000 mg | ORAL_TABLET | Freq: Every day | ORAL | Status: DC

## 2014-02-19 MED ORDER — LOSARTAN POTASSIUM 25 MG PO TABS: 25.0000 mg | ORAL_TABLET | Freq: Every day | ORAL | Status: DC

## 2014-02-19 NOTE — Telephone Encounter (Signed)
Rx was sent to pharmacy electronically. 

## 2014-02-24 ENCOUNTER — Other Ambulatory Visit (HOSPITAL_COMMUNITY): Payer: Self-pay | Admitting: Gastroenterology

## 2014-02-24 ENCOUNTER — Other Ambulatory Visit: Payer: Self-pay | Admitting: Physician Assistant

## 2014-02-24 DIAGNOSIS — R140 Abdominal distension (gaseous): Secondary | ICD-10-CM

## 2014-03-03 ENCOUNTER — Ambulatory Visit (HOSPITAL_COMMUNITY)
Admission: RE | Admit: 2014-03-03 | Discharge: 2014-03-03 | Disposition: A | Discharge status: Home / Self Care | Payer: Medicare Other | Source: Ambulatory Visit | Attending: Gastroenterology | Admitting: Gastroenterology

## 2014-03-03 DIAGNOSIS — R141 Gas pain: Secondary | ICD-10-CM | POA: Diagnosis not present

## 2014-03-03 DIAGNOSIS — R142 Eructation: Secondary | ICD-10-CM

## 2014-03-03 DIAGNOSIS — R109 Unspecified abdominal pain: Secondary | ICD-10-CM | POA: Diagnosis not present

## 2014-03-03 DIAGNOSIS — R143 Flatulence: Secondary | ICD-10-CM

## 2014-03-03 DIAGNOSIS — R140 Abdominal distension (gaseous): Secondary | ICD-10-CM

## 2014-03-03 MED ORDER — SINCALIDE 5 MCG IJ SOLR
0.0200 ug/kg | Freq: Once | INTRAMUSCULAR | Status: AC
  Administered 2014-03-03: 2.18 ug via INTRAVENOUS

## 2014-03-03 MED ORDER — TECHNETIUM TC 99M MEBROFENIN IV KIT
5.0000 | PACK | Freq: Once | INTRAVENOUS | Status: AC | PRN
  Administered 2014-03-03: 5 via INTRAVENOUS

## 2014-03-03 MED ORDER — SINCALIDE 5 MCG IJ SOLR
INTRAMUSCULAR | Status: AC
Start: 2014-03-03 — End: 2014-03-03
  Administered 2014-03-03: 2.18 ug via INTRAVENOUS
  Filled 2014-03-03: qty 10

## 2014-04-08 ENCOUNTER — Other Ambulatory Visit: Payer: Self-pay | Admitting: Gastroenterology

## 2014-07-13 ENCOUNTER — Encounter (HOSPITAL_COMMUNITY): Payer: Self-pay | Admitting: Emergency Medicine

## 2014-07-13 ENCOUNTER — Emergency Department (HOSPITAL_COMMUNITY)
Admission: EM | Admit: 2014-07-13 | Discharge: 2014-07-13 | Disposition: A | Discharge status: Home / Self Care | Payer: Medicare Other | Attending: Emergency Medicine | Admitting: Emergency Medicine

## 2014-07-13 ENCOUNTER — Emergency Department (HOSPITAL_COMMUNITY): Payer: Medicare Other

## 2014-07-13 DIAGNOSIS — I10 Essential (primary) hypertension: Secondary | ICD-10-CM | POA: Insufficient documentation

## 2014-07-13 DIAGNOSIS — R1011 Right upper quadrant pain: Secondary | ICD-10-CM

## 2014-07-13 DIAGNOSIS — E119 Type 2 diabetes mellitus without complications: Secondary | ICD-10-CM | POA: Insufficient documentation

## 2014-07-13 DIAGNOSIS — G8929 Other chronic pain: Secondary | ICD-10-CM | POA: Insufficient documentation

## 2014-07-13 DIAGNOSIS — E785 Hyperlipidemia, unspecified: Secondary | ICD-10-CM | POA: Insufficient documentation

## 2014-07-13 DIAGNOSIS — F419 Anxiety disorder, unspecified: Secondary | ICD-10-CM | POA: Diagnosis not present

## 2014-07-13 DIAGNOSIS — E039 Hypothyroidism, unspecified: Secondary | ICD-10-CM | POA: Insufficient documentation

## 2014-07-13 DIAGNOSIS — Z79899 Other long term (current) drug therapy: Secondary | ICD-10-CM | POA: Insufficient documentation

## 2014-07-13 DIAGNOSIS — R109 Unspecified abdominal pain: Secondary | ICD-10-CM

## 2014-07-13 HISTORY — DX: Fatty (change of) liver, not elsewhere classified: K76.0

## 2014-07-13 LAB — CBC
HCT: 40.7 % (ref 36.0–46.0)
Hemoglobin: 13.5 g/dL (ref 12.0–15.0)
MCH: 29 pg (ref 26.0–34.0)
MCHC: 33.2 g/dL (ref 30.0–36.0)
MCV: 87.3 fL (ref 78.0–100.0)
Platelets: 295 10*3/uL (ref 150–400)
RBC: 4.66 MIL/uL (ref 3.87–5.11)
RDW: 12.8 % (ref 11.5–15.5)
WBC: 5.6 10*3/uL (ref 4.0–10.5)

## 2014-07-13 LAB — COMPREHENSIVE METABOLIC PANEL
ALK PHOS: 66 U/L (ref 39–117)
ALT: 20 U/L (ref 0–35)
AST: 22 U/L (ref 0–37)
Albumin: 3.6 g/dL (ref 3.5–5.2)
Anion gap: 8 (ref 5–15)
BILIRUBIN TOTAL: 0.8 mg/dL (ref 0.3–1.2)
BUN: 7 mg/dL (ref 6–23)
CO2: 25 mmol/L (ref 19–32)
CREATININE: 0.44 mg/dL — AB (ref 0.50–1.10)
Calcium: 9.2 mg/dL (ref 8.4–10.5)
Chloride: 105 mmol/L (ref 96–112)
GFR calc Af Amer: >90 mL/min (ref >90)
GFR calc non Af Amer: >90 mL/min (ref >90)
GLUCOSE: 112 mg/dL — AB (ref 70–99)
Potassium: 3.8 mmol/L (ref 3.5–5.1)
Sodium: 138 mmol/L (ref 135–145)
TOTAL PROTEIN: 6.7 g/dL (ref 6.0–8.3)

## 2014-07-13 LAB — URINALYSIS, ROUTINE W REFLEX MICROSCOPIC
Bilirubin Urine: NEGATIVE
GLUCOSE, UA: NEGATIVE mg/dL
Hgb urine dipstick: NEGATIVE
Ketones, ur: NEGATIVE mg/dL
LEUKOCYTES UA: NEGATIVE
NITRITE: NEGATIVE
Protein, ur: NEGATIVE mg/dL
Specific Gravity, Urine: 1.018 (ref 1.005–1.030)
UROBILINOGEN UA: 0.2 mg/dL (ref 0.0–1.0)
pH: 7.5 (ref 5.0–8.0)

## 2014-07-13 LAB — LIPASE, BLOOD: Lipase: 28 U/L (ref 11–59)

## 2014-07-13 MED ORDER — SODIUM CHLORIDE 0.9 % IV BOLUS (SEPSIS)
500.0000 mL | Freq: Once | INTRAVENOUS | Status: AC
  Administered 2014-07-13: 500 mL via INTRAVENOUS

## 2014-07-13 MED ORDER — HYDROMORPHONE HCL 1 MG/ML IJ SOLN
1.0000 mg | Freq: Once | INTRAMUSCULAR | Status: AC
  Administered 2014-07-13: 1 mg via INTRAVENOUS
  Filled 2014-07-13: qty 1

## 2014-07-13 MED ORDER — ONDANSETRON HCL 4 MG/2ML IJ SOLN
4.0000 mg | Freq: Once | INTRAMUSCULAR | Status: AC
  Administered 2014-07-13: 4 mg via INTRAVENOUS
  Filled 2014-07-13: qty 2

## 2014-07-13 MED ORDER — PANTOPRAZOLE SODIUM 40 MG IV SOLR
40.0000 mg | Freq: Once | INTRAVENOUS | Status: AC
  Administered 2014-07-13: 40 mg via INTRAVENOUS
  Filled 2014-07-13: qty 40

## 2014-07-13 MED ORDER — IOHEXOL 300 MG/ML  SOLN
100.0000 mL | Freq: Once | INTRAMUSCULAR | Status: AC | PRN
Start: 2014-07-13 — End: 2014-07-13
  Administered 2014-07-13: 100 mL via INTRAVENOUS

## 2014-07-13 MED ORDER — IOHEXOL 300 MG/ML  SOLN
25.0000 mL | Freq: Once | INTRAMUSCULAR | Status: AC | PRN
  Administered 2014-07-13: 25 mL via ORAL

## 2014-07-13 MED ORDER — LORAZEPAM 2 MG/ML IJ SOLN
1.0000 mg | Freq: Once | INTRAMUSCULAR | Status: AC
  Administered 2014-07-13: 1 mg via INTRAVENOUS
  Filled 2014-07-13: qty 1

## 2014-07-13 MED ORDER — PANTOPRAZOLE SODIUM 40 MG PO TBEC: 40.0000 mg | DELAYED_RELEASE_TABLET | Freq: Every day | ORAL | Status: DC

## 2014-07-13 NOTE — ED Notes (Signed)
Pt reports chronic RUQ abd pain which radiates through to her back and throughout the rest of her abd. Pt reports that the pain has recently gotten worse. Pt denies any urinary symptoms. NAD at this time. Pt alert x4.

## 2014-07-13 NOTE — ED Notes (Signed)
Patient transported to CT 

## 2014-07-13 NOTE — ED Provider Notes (Signed)
CSN: [id number]     Arrival date & time 07/13/14  1023 History   First MD Initiated Contact with Patient 07/13/14 1028     Chief Complaint  Patient presents with  . Abdominal Pain     (Consider location/radiation/quality/duration/timing/severity/associated sxs/prior Treatment) Patient is a 74 y.o. female presenting with abdominal pain. The history is provided by the patient.  Abdominal Pain Associated symptoms: no chest pain, no chills, no cough, no dysuria, no fever, no hematuria, no shortness of breath and no sore throat   pt c/o mid to right upper abdominal pain for the past 2 years. States pain occurs daily. Has had eval by pcp and her gi doctor/Hayes for same in past - states as of yet no dx made and pain persists.  Pain constant, dull, mod-severe, non radiating.  No specific exacerbating or alleviating factors. No hematuria or dysuria. No hx kidney stones or gallstones. No hx pancreatitis. No hx pud. +hx gerd. Only prior abd surgery is remote hx c section and hysterectomy. Is having normal bms. No nvd. No fever or chills. No chest pain or discomfort. No sob. No cough or uri c/o.      Past Medical History  Diagnosis Date  . Morbid obesity with BMI of 45.0-49.9, adult     BMI ~45; 5'2" 242 lb  . HTN (hypertension)   . 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 Dyslipidemia   . Hypothyroidism    Past Surgical History  Procedure Laterality Date  . Doppler echocardiography  09/20/2012    normal LV size and function with an EF of  . Nm myoview ltd  11/06/2012    EF 71%.  No ischemia or infarction.  Anterior defect consistent with breast attenuation   Family History  Problem Relation Age of Onset  . Arthritis Mother   . Mitral valve prolapse Mother   . Hypothyroidism Mother   . Heart attack Father   . Diabetes Sister   . Hypothyroidism Paternal Grandfather   . Diabetes Sister   . Arthritis Sister   . Heart disease Sister    History  Substance Use  Topics  . Smoking status: Never Smoker   . Smokeless tobacco: Never Used  . Alcohol Use: No   OB History    No data available     Review of Systems  Constitutional: Negative for fever and chills.  HENT: Negative for sore throat.   Eyes: Negative for redness.  Respiratory: Negative for cough and shortness of breath.   Cardiovascular: Negative for chest pain.  Gastrointestinal: Positive for abdominal pain.  Genitourinary: Negative for dysuria, hematuria and flank pain.  Musculoskeletal: Negative for back pain and neck pain.  Skin: Negative for rash.  Neurological: Negative for headaches.  Hematological: Does not bruise/bleed easily.  Psychiatric/Behavioral: Negative for confusion.      Allergies  Codeine; Cortisone; and Sulfa antibiotics  Home Medications   Prior to Admission medications   Medication Sig Start Date End Date Taking? Authorizing Provider  ALPHA LIPOIC ACID PO Take 1 tablet by mouth 2 (two) times daily.    Historical Provider, MD  ALPRAZolam Prudy Feeler(XANAX) 0.5 MG tablet Take 1 tablet by mouth as needed. 08/05/13   Historical Provider, MD  Ascorbic Acid (VITAMIN C PO) Take 1 tablet by mouth daily.    Historical Provider, MD  B Complex Vitamins (B COMPLEX PO) Place 1 tablet under the tongue daily.    Historical Provider, MD  BIOTIN PO  Take 1 tablet by mouth daily.    Historical Provider, MD  CHROMIUM PO Take 1 tablet by mouth daily.    Historical Provider, MD  Coenzyme Q10 (CO Q-10) 100 MG CAPS Take 100 mg by mouth 2 (two) times daily.    Historical Provider, MD  furosemide (LASIX) 20 MG tablet Take 1 tablet (20 mg total) by mouth daily. 02/19/14   Marykay Lex, MD  Gymnema Sylvestris Leaf POWD Take 1 capsule by mouth daily.    Historical Provider, MD  isosorbide mononitrate (IMDUR) 30 MG 24 hr tablet Take 1 tablet (30 mg total) by mouth daily. 09/26/13   Marykay Lex, MD  losartan (COZAAR) 25 MG tablet Take 1 tablet (25 mg total) by mouth daily. 02/19/14   Marykay Lex, MD  Multiple Vitamins-Minerals (MULTIVITAMIN PO) Take 1 tablet by mouth daily.    Historical Provider, MD  Omega-3 Fatty Acids (FISH OIL PO) Take 1 capsule by mouth daily.    Historical Provider, MD  ONE TOUCH ULTRA TEST test strip  02/01/13   Historical Provider, MD  OVER THE COUNTER MEDICATION Take 1 each by mouth at bedtime. CAL-MAX (calcium-magnesium-vit C)    Historical Provider, MD  propranolol (INDERAL) 20 MG tablet Take 20 mg by mouth 2 (two) times daily. 11/07/12   Historical Provider, MD  SYNTHROID 125 MCG tablet Take 125 mcg by mouth daily. 11/07/12   Historical Provider, MD  VANADYL SULFATE PO Take 1 tablet by mouth 2 (two) times daily.    Historical Provider, MD   BP 124/66 mmHg  Pulse 63  Resp 18  Ht  (1.575 m)  Wt 234 lb (106.142 kg)  BMI 42.79 kg/m2  SpO2 99% Physical Exam  Constitutional: She appears well-developed and well-nourished. No distress.  HENT:  Mouth/Throat: Oropharynx is clear and moist.  Eyes: Conjunctivae are normal. No scleral icterus.  Neck: Neck supple. No tracheal deviation present.  Cardiovascular: Normal rate, regular rhythm, normal heart sounds and intact distal pulses.  Exam reveals no gallop and no friction rub.   No murmur heard. Pulmonary/Chest: Effort normal and breath sounds normal. No respiratory distress.  Abdominal: Soft. Normal appearance. She exhibits no distension. There is tenderness.  Mild upper abd tenderness, no rebound or guarding. No hernia.   Genitourinary:  No cva tenderness  Musculoskeletal: She exhibits no edema.  Neurological: She is alert.  Skin: Skin is warm and dry. No rash noted.  Psychiatric: She has a normal mood and affect.  Nursing note and vitals reviewed.   ED Course  Procedures (including critical care time) Labs Review   Results for orders placed or performed during the hospital encounter of 07/13/14  CBC  Result Value Ref Range   WBC 5.6 4.0 - 10.5 K/uL   RBC 4.66 3.87 - 5.11 MIL/uL    Hemoglobin 13.5 12.0 - 15.0 g/dL   HCT 16.1 09.6 - 04.5 %   MCV 87.3 78.0 - 100.0 fL   MCH 29.0 26.0 - 34.0 pg   MCHC 33.2 30.0 - 36.0 g/dL   RDW 40.9 81.1 - 91.4 %   Platelets 295 150 - 400 K/uL  Comprehensive metabolic panel  Result Value Ref Range   Sodium 138 135 - 145 mmol/L   Potassium 3.8 3.5 - 5.1 mmol/L   Chloride 105 96 - 112 mmol/L   CO2 25 19 - 32 mmol/L   Glucose, Bld 112 (H) 70 - 99 mg/dL   BUN 7 6 - 23 mg/dL  Creatinine, Ser 0.44 (L) 0.50 - 1.10 mg/dL   Calcium 9.2 8.4 - 16.1 mg/dL   Total Protein 6.7 6.0 - 8.3 g/dL   Albumin 3.6 3.5 - 5.2 g/dL   AST 22 0 - 37 U/L   ALT 20 0 - 35 U/L   Alkaline Phosphatase 66 39 - 117 U/L   Total Bilirubin 0.8 0.3 - 1.2 mg/dL   GFR calc non Af Amer >90 >90 mL/min   GFR calc Af Amer >90 >90 mL/min   Anion gap 8 5 - 15  Lipase, blood  Result Value Ref Range   Lipase 28 11 - 59 U/L  Urinalysis, Routine w reflex microscopic  Result Value Ref Range   Color, Urine YELLOW YELLOW   APPearance CLEAR CLEAR   Specific Gravity, Urine 1.018 1.005 - 1.030   pH 7.5 5.0 - 8.0   Glucose, UA NEGATIVE NEGATIVE mg/dL   Hgb urine dipstick NEGATIVE NEGATIVE   Bilirubin Urine NEGATIVE NEGATIVE   Ketones, ur NEGATIVE NEGATIVE mg/dL   Protein, ur NEGATIVE NEGATIVE mg/dL   Urobilinogen, UA 0.2 0.0 - 1.0 mg/dL   Nitrite NEGATIVE NEGATIVE   Leukocytes, UA NEGATIVE NEGATIVE   Ct Abdomen Pelvis W Contrast  07/13/2014   CLINICAL DATA:  RUQ PAIN X 2 YEARS, RADIATES THROUGHOUT ABD AND INTO HER BACK, RECENT INCREASE IN LEVEL OF PAIN, NAUSEA, HX HTN-DM-FATTY LIVER-HYSTERECTOMY,  EXAM: CT ABDOMEN AND PELVIS WITH CONTRAST  TECHNIQUE: Multidetector CT imaging of the abdomen and pelvis was performed using the standard protocol following bolus administration of intravenous contrast.  CONTRAST:  OMNIPAQUE IOHEXOL 300 MG/ML  SOLN  COMPARISON:  HIDA scan, normal, 09/31/2015.  FINDINGS: Heart mildly enlarged. Is mild linear opacity in the left lower lobe  consistent with atelectasis.  Small hiatal hernia.  Dense calcification in the central right liver lobe, consistent with healed granuloma. Liver otherwise unremarkable.  Spleen, pancreas, gallbladder, adrenal glands:  Normal.  Kidneys, ureters and bladder are unremarkable.  Uterus surgically absent.  No pelvic masses.  No pathologically enlarged lymph nodes. No abnormal fluid collections.  Colon and small bowel are unremarkable. A probable decompressed appendix extends posteriorly the cecum.  Bony structures are demineralized. There are degenerative changes throughout visualized spine.  IMPRESSION: 1. No acute findings. No findings to explain chronic right upper quadrant pain. 2. Chronic findings include a small hiatal hernia, a liver calcifications consistent with healed granuloma, changes from hysterectomy and degenerative changes throughout the visualized spine.   Electronically Signed   By: Amie Portland M.D.   On: 07/13/2014 13:42      MDM   Iv ns. Labs.   Dilaudid 1 mg iv. zofran iv. protonix iv.  Reviewed nursing notes and prior charts for additional history.   Recheck pt appears more comfortable.  Ct results noted - discussed w pt.   Pt appears anxious and frustrated that labs and ct normal.  Reviewed prior u/s and hida scan results w pt -  Also normal.    abd soft nt. No vomiting in ED. Tolerating po.  Given ativan for anxiety  Pt has pcp and gi doctor with whom she can follow up closely as outpt.   Pt currently appears stable for d/c. Return precautions provided.     Suzi Roots, MD 07/13/14 [phone]

## 2014-07-13 NOTE — ED Notes (Signed)
Patient returned from CT

## 2014-07-13 NOTE — ED Notes (Signed)
Patient remains very upset and frustrated that all testing and labs are normal. She is not happy with going home without a definitive answer. Discussed the role of the ED, discussed follow up with specialty MD. Memory Argueffered to get ED MD into room again to answer further questions, but she declines.

## 2014-07-13 NOTE — Discharge Instructions (Signed)
It was our pleasure to provide your ER care today - we hope that you feel better.  Take protonix (acid blocker medication).  You may also try maalox, mylanta, or gas-x as need for symptom relief.  Follow up with your doctor/gi doctor in the next week.  Return to ER if worse, new symptoms, fevers, persistent vomiting, other concern.  You were given pain medication in the ER - no driving for the next 4 hours.      Abdominal Pain Many things can cause abdominal pain. Usually, abdominal pain is not caused by a disease and will improve without treatment. It can often be observed and treated at home. Your health care provider will do a physical exam and possibly order blood tests and X-rays to help determine the seriousness of your pain. However, in many cases, more time must pass before a clear cause of the pain can be found. Before that point, your health care provider may not know if you need more testing or further treatment. HOME CARE INSTRUCTIONS  Monitor your abdominal pain for any changes. The following actions may help to alleviate any discomfort you are experiencing:  Only take over-the-counter or prescription medicines as directed by your health care provider.  Do not take laxatives unless directed to do so by your health care provider.  Try a clear liquid diet (broth, tea, or water) as directed by your health care provider. Slowly move to a bland diet as tolerated. SEEK MEDICAL CARE IF:  You have unexplained abdominal pain.  You have abdominal pain associated with nausea or diarrhea.  You have pain when you urinate or have a bowel movement.  You experience abdominal pain that wakes you in the night.  You have abdominal pain that is worsened or improved by eating food.  You have abdominal pain that is worsened with eating fatty foods.  You have a fever. SEEK IMMEDIATE MEDICAL CARE IF:   Your pain does not go away within 2 hours.  You keep throwing up  (vomiting).  Your pain is felt only in portions of the abdomen, such as the right side or the left lower portion of the abdomen.  You pass bloody or black tarry stools. MAKE SURE YOU:  Understand these instructions.   Will watch your condition.   Will get help right away if you are not doing well or get worse.  Document Released: 03/09/2005 Document Revised: 06/04/2013 Document Reviewed: 02/06/2013 [facility] Patient Information 2015 [place], [place]. This information is not intended to replace advice given to you by your health care provider. Make sure you discuss any questions you have with your health care provider.

## 2014-07-13 NOTE — ED Notes (Signed)
Patient is finished with PO contrast. Notified CT.

## 2014-09-29 ENCOUNTER — Other Ambulatory Visit: Payer: Self-pay

## 2014-09-29 DIAGNOSIS — Z1231 Encounter for screening mammogram for malignant neoplasm of breast: Secondary | ICD-10-CM

## 2014-10-06 ENCOUNTER — Ambulatory Visit
Admission: RE | Admit: 2014-10-06 | Discharge: 2014-10-06 | Disposition: A | Discharge status: Home / Self Care | Payer: Medicare Other | Source: Ambulatory Visit

## 2014-10-06 DIAGNOSIS — Z1231 Encounter for screening mammogram for malignant neoplasm of breast: Secondary | ICD-10-CM

## 2014-11-17 ENCOUNTER — Institutional Professional Consult (permissible substitution): Payer: Medicare Other | Admitting: Internal Medicine

## 2014-11-27 ENCOUNTER — Other Ambulatory Visit: Payer: Self-pay | Admitting: Cardiology

## 2014-11-27 NOTE — Telephone Encounter (Signed)
Rx(s) sent to pharmacy electronically.  

## 2014-11-28 ENCOUNTER — Other Ambulatory Visit: Payer: Self-pay | Admitting: Cardiology

## 2014-11-28 NOTE — Telephone Encounter (Signed)
Rx(s) sent to pharmacy electronically.  

## 2014-12-27 ENCOUNTER — Other Ambulatory Visit: Payer: Self-pay | Admitting: Cardiology

## 2014-12-29 ENCOUNTER — Telehealth: Payer: Self-pay | Admitting: Cardiology

## 2014-12-30 NOTE — Telephone Encounter (Signed)
Closed encounter °

## 2015-01-09 ENCOUNTER — Ambulatory Visit: Payer: Medicare Other | Admitting: Cardiology

## 2015-01-27 ENCOUNTER — Other Ambulatory Visit: Payer: Self-pay | Admitting: Cardiology

## 2015-01-27 NOTE — Telephone Encounter (Signed)
Rx(s) sent to pharmacy electronically. OV 02/04/15

## 2015-02-04 ENCOUNTER — Ambulatory Visit (INDEPENDENT_AMBULATORY_CARE_PROVIDER_SITE_OTHER): Payer: Medicare Other | Admitting: Cardiology

## 2015-02-04 VITALS — BP 132/74 | HR 57 | Ht 62.0 in | Wt 229.0 lb

## 2015-02-04 DIAGNOSIS — E669 Obesity, unspecified: Secondary | ICD-10-CM

## 2015-02-04 DIAGNOSIS — R600 Localized edema: Secondary | ICD-10-CM

## 2015-02-04 DIAGNOSIS — I10 Essential (primary) hypertension: Secondary | ICD-10-CM | POA: Diagnosis not present

## 2015-02-04 DIAGNOSIS — E785 Hyperlipidemia, unspecified: Secondary | ICD-10-CM | POA: Diagnosis not present

## 2015-02-04 DIAGNOSIS — R079 Chest pain, unspecified: Secondary | ICD-10-CM

## 2015-02-04 DIAGNOSIS — E119 Type 2 diabetes mellitus without complications: Secondary | ICD-10-CM

## 2015-02-04 DIAGNOSIS — E1169 Type 2 diabetes mellitus with other specified complication: Secondary | ICD-10-CM

## 2015-02-04 DIAGNOSIS — Z6841 Body Mass Index (BMI) 40.0 and over, adult: Secondary | ICD-10-CM
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
## 2015-02-04 NOTE — Patient Instructions (Addendum)
Your physician wants you to follow-up in 12 months with DR HARDING. You will receive a reminder letter in the mail two months in advance. If you don't receive a letter, please call our office to schedule the follow-up appointment.   IF SWELLING GETS LITTLE WORSE MAY USE AN EXTRA DOSE OF YOUR FLUID PILL.  CONTINUE EXERCISING   CONTINUE WITH CURRENT MEDICATIONS.

## 2015-02-06 ENCOUNTER — Encounter: Payer: Self-pay | Admitting: Cardiology

## 2015-02-06 NOTE — Assessment & Plan Note (Signed)
She has done a pretty good job of 11 pound weight loss in the last 4 months. I congratulated her on her efforts and encouraged her to continue.

## 2015-02-06 NOTE — Assessment & Plan Note (Signed)
I don't think that her deep chest discomfort episodes are nonexertional are cardiac in nature. Probably just related to her costochondritis.  Cannot fully exclude microvascular disease, but less likely. No longer taking Imdur as it was not significant a difference. The episodes are so rare that we don't need to treat. Said that he continue aspirin and beta blocker.

## 2015-02-06 NOTE — Assessment & Plan Note (Signed)
Stable on current dose of Lasix. Okay to use when necessary additional dose.

## 2015-02-06 NOTE — Assessment & Plan Note (Signed)
Continues to be on omega-3 fatty acids and coenzyme Q10. Last monitored by PCP. Goal LDL roughly 100- 120

## 2015-02-06 NOTE — Assessment & Plan Note (Signed)
Well-controlled on current dose of losartan. 

## 2015-02-26 ENCOUNTER — Other Ambulatory Visit: Payer: Self-pay | Admitting: Cardiology

## 2015-02-26 NOTE — Telephone Encounter (Signed)
Rx(s) sent to pharmacy electronically.  

## 2016-02-18 ENCOUNTER — Other Ambulatory Visit: Payer: Self-pay | Admitting: Cardiology

## 2016-03-16 ENCOUNTER — Ambulatory Visit (INDEPENDENT_AMBULATORY_CARE_PROVIDER_SITE_OTHER): Payer: Medicare Other | Admitting: Cardiology

## 2016-03-16 ENCOUNTER — Encounter: Payer: Self-pay | Admitting: Cardiology

## 2016-03-16 VITALS — BP 138/68 | HR 61 | Ht 62.0 in | Wt 233.4 lb

## 2016-03-16 DIAGNOSIS — R600 Localized edema: Secondary | ICD-10-CM

## 2016-03-16 DIAGNOSIS — R079 Chest pain, unspecified: Secondary | ICD-10-CM

## 2016-03-16 DIAGNOSIS — E785 Hyperlipidemia, unspecified: Secondary | ICD-10-CM | POA: Diagnosis not present

## 2016-03-16 DIAGNOSIS — I10 Essential (primary) hypertension: Secondary | ICD-10-CM | POA: Diagnosis not present

## 2016-03-16 DIAGNOSIS — Z6841 Body Mass Index (BMI) 40.0 and over, adult: Secondary | ICD-10-CM

## 2016-03-16 MED ORDER — PROPRANOLOL HCL 20 MG PO TABS: 20.0000 mg | ORAL_TABLET | Freq: Two times a day (BID) | ORAL | 3 refills | Status: DC

## 2016-03-16 MED ORDER — FUROSEMIDE 20 MG PO TABS: 20.0000 mg | ORAL_TABLET | Freq: Every day | ORAL | 3 refills | Status: DC

## 2016-03-16 MED ORDER — LOSARTAN POTASSIUM 25 MG PO TABS: 25.0000 mg | ORAL_TABLET | Freq: Every day | ORAL | 3 refills | Status: DC

## 2016-03-16 NOTE — Progress Notes (Signed)
PCP: Georgann Housekeeper, MD  Clinic Note: Chief Complaint  Patient presents with  . Follow-up    Annual    HPI: Rebecca Carey is a 75 y.o. female with a PMH below who presents today for 18 month followup of cardiac risk factors including hypertension, obesity.  She also history of nonischemic cardiomyopathy that apparently has resolved. She has a negative Myoview to evaluate chest discomfort or palpitations.[name] was last seen in August 2016.  Recent Hospitalizations: n/a  Studies Reviewed: PCP check labs in April. She said her total cholesterol dropped to 201, but does not know the rest.  Interval History: She presents today doing relatively well overall cardiac standpoint. She has had some balance issues and does let to her to have about 2 falls its cervical vertigo type balance issue. She has also noted some occasional lightheadedness and dizziness that seemed orthostatic, but not nearly as frequent as she had. She has a chronic aching sensation in her chest that seems to be worse with stress, but the symptom can last all week. Is not exacerbated with activity or even was dressed is the fact that occur when she is having stress. She really denies any significant palpitations or rapid irregular heartbeats - but does note occasional flip-flopping  Other Cardiovascular ROS: positive for - chest pain, edema and palpitations negative for - dyspnea on exertion, irregular heartbeat, loss of consciousness, orthopnea, paroxysmal nocturnal dyspnea, rapid heart rate, shortness of breath or Syncope/near syncope or TIA so some procedures. She does get short of breath with rapid or over exertion but not at rest or with routine activity.   Past Medical History:  Diagnosis Date  . Diabetes mellitus type 2 in obese (HCC)    Using "herbal therapy"  . Dyslipidemia   . Fatty liver   . HTN (hypertension)   . Hypothyroidism   . Morbid obesity with BMI of 45.0-49.9, adult (HCC)    BMI ~45; 5'2" 242 lb    Past Surgical History:  Procedure Laterality Date  . DOPPLER ECHOCARDIOGRAPHY  09/20/2012   normal LV size and function with an EF of  . NM MYOVIEW LTD  11/06/2012   EF 71%.  No ischemia or infarction.  Anterior defect consistent with breast attenuation    ROS: A comprehensive was performed.  Pertinent positives noted above Review of Systems  Constitutional: Negative for malaise/fatigue.  HENT: Negative for congestion and nosebleeds.   Eyes: Negative for blurred vision.  Respiratory: Positive for cough (, But less prominent).   Cardiovascular: Positive for leg swelling (As well as hands - mild).       Per history of present illness  Gastrointestinal: Negative for blood in stool and melena.  Genitourinary: Negative for flank pain and hematuria.  Musculoskeletal: Negative for falls and joint pain.  Neurological: Positive for dizziness. Negative for headaches.  Endo/Heme/Allergies: Does not bruise/bleed easily.  Psychiatric/Behavioral: Positive for depression. Negative for memory loss. The patient is nervous/anxious and has insomnia.   All other systems reviewed and are negative.   Prior to Admission medications   Medication Sig Start Date Taking? Authorizing Provider  ALPHA LIPOIC ACID PO Take 1 tablet by mouth daily.   Yes Historical Provider, MD  ALPRAZolam (XANAX) 0.25 MG tablet Take 0.125 mg by mouth at bedtime as needed for anxiety.  Yes Historical Provider, MD  Ascorbic Acid (VITAMIN C PO) Take 1 tablet by mouth daily.  Yes Historical Provider, MD  aspirin 81 MG tablet Take 81 mg  by mouth daily.  Yes Historical Provider, MD  B-12, Methylcobalamin, 1000 MCG SUBL Place 1 tablet under the tongue daily.  Yes Historical Provider, MD  BIOTIN PO Take 1 tablet by mouth daily.  Yes Historical Provider, MD  CHROMIUM PO Take 1 tablet by mouth daily.  Yes Historical Provider, MD  Coenzyme Q10 (COQ10) 100 MG CAPS Take 100 mg by mouth daily.  Yes Historical  Provider, MD  furosemide (LASIX) 20 MG tablet TAKE 1 TABLET (20 MG TOTAL) BY MOUTH DAILY. 02/18/16 Yes Marykay Lexavid W Harding, MD  losartan (COZAAR) 25 MG tablet Take 1 tablet (25 mg total) by mouth daily. 02/26/15 Yes Marykay Lexavid W Harding, MD  Multiple Vitamins-Minerals (MULTIVITAMIN PO) Take 1 tablet by mouth daily.  Yes Historical Provider, MD  Omega-3 Fatty Acids (FISH OIL PO) Take 1 capsule by mouth daily.  Yes Historical Provider, MD  ONE TOUCH ULTRA TEST test strip  02/01/13 Yes Historical Provider, MD  OVER THE COUNTER MEDICATION Take 1 capsule by mouth daily. Med Name: GYMNEMA SYLVERTRE LEAF  Yes Historical Provider, MD  OVER THE COUNTER MEDICATION Med Name: CalMax Mix one (1) packet into water to take by mouth daily at bedtime.  Yes Historical Provider, MD  propranolol (INDERAL) 20 MG tablet Take 20 mg by mouth 2 (two) times daily. 11/07/12 Yes Historical Provider, MD  SYNTHROID 125 MCG tablet Take 125 mcg by mouth daily before breakfast. 03/01/16 Yes Historical Provider, MD    Allergies  Allergen Reactions  . Codeine Nausea And Vomiting    unspecified  . Cortisone Other (See Comments)    Turns face and skin red, and warm. Makes heart beat out of chest   . Sulfa Antibiotics Other (See Comments)    Unspecified.     Social History   Social History  . Marital status: Married    Spouse name: N/A  . Number of children: N/A  . Years of education: N/A   Social History Main Topics  . Smoking status: Never Smoker  . Smokeless tobacco: Never Used  . Alcohol use No  . Drug use: No  . Sexual activity: Not Asked   Other Topics Concern  . None   Social History Narrative   She is a married mother of 1. She tries to walk every day, does not smoke and does not drink.      She has a significant family history coronary disease.   Family History  Problem Relation Age of Onset  . Arthritis Mother   . Mitral valve prolapse Mother   . Hypothyroidism Mother   . Heart attack Father   . Diabetes  Sister   . Diabetes Sister   . Arthritis Sister   . Heart disease Sister   . Hypothyroidism Paternal Grandfather     Wt Readings from Last 3 Encounters:  03/16/16 105.9 kg (233 lb 6.4 oz)  02/04/15 103.9 kg (229 lb)  07/13/14 106.1 kg (234 lb)    PHYSICAL EXAM BP 138/68   Pulse 61   Ht 5\' 2"  (1.575 m)   Wt 105.9 kg (233 lb 6.4 oz)   BMI 42.69 kg/m  General: She is a very pleasant, very nervous appearing, morbidly obese woman. She is in no acute distress, alert and oriented x3, answers questions appropriately. She is well-groomed. HEENT: NCAT, EOMI, MMM and anicteric sclerae.  Neck: Supple, no LAN, JVD or carotid bruit. Normal carotid upstrokes.  Lungs: CTAB, nonlabored, normal effort, good air movement.  Heart: RRR, normal S1, S2, no M/R/G.  Nondisplaced PMI.  Abdomen: Obese but otherwise soft/NT/ND/NABS. No HSM.  Extremities: No C/C/E, 2+ and equal pulses throughout. Mild puffiness, ~1+ nonpitting edema in the lower extremities and ankles and elbows.   Adult ECG Report  Rate: 57 ;  Rhythm: normal sinus rhythm and Otherwise normal axis and intervals, durations and voltage.;   Narrative Interpretation: essentially normal EKG   Other studies Reviewed: Additional studies/ records that were reviewed today include:  Recent Labs:  No recent labs available - PCP No results found for: CHOL, HDL, LDLCALC, LDLDIRECT, TRIG, CHOLHDL   ASSESSMENT / PLAN: Problem List Items Addressed This Visit    None    Visit Diagnoses   None.      Current medicines are reviewed at length with the patient today. (+/- concerns) n/a The following changes have been made:   IF SWELLING GETS LITTLE WORSE MAY USE AN EXTRA DOSE OF YOUR FLUID PILL.  CONTINUE EXERCISING   CONTINUE WITH CURRENT MEDICATIONS -- but would switch losartan to daily at bedtime.  Studies Ordered: - labs to be checked by PCP  No orders of the defined types were placed in this encounter.  ROV - 1 yr    Bryan Lemma, M.D., M.S. Interventional Cardiologist   Pager # [phone]

## 2016-03-16 NOTE — Patient Instructions (Addendum)
PLEASE START TAKING LOSARTAN AT BEDTIME   NO OTHER CHANGES    Your physician wants you to follow-up in: 12 MONTHS WITH DR HARDING. You will receive a reminder letter in the mail two months in advance. If you don't receive a letter, please call our office to schedule the follow-up appointment.  .If you need a refill on your cardiac medications before your next appointment, please call your pharmacy.

## 2016-03-18 ENCOUNTER — Encounter: Payer: Self-pay | Admitting: Cardiology

## 2016-03-18 NOTE — Assessment & Plan Note (Signed)
Continues to lose weight steadily. I congratulated her efforts. Continue to recommend increased activity as well as dietary modification

## 2016-03-18 NOTE — Assessment & Plan Note (Signed)
DG chest discomfort episodes that are non-and exertional in nature. She's had a negative Myoview to evaluate similar symptoms in the past. Likely not cardiac. Lid microvascular disease, but she didn't notice any improvement in symptoms with the Imdur. She is on a beta blocker and ARB regardless for microvascular disease.

## 2016-03-18 NOTE — Assessment & Plan Note (Signed)
On stable dose of Lasix. Also recommended elevating feet

## 2016-03-18 NOTE — Assessment & Plan Note (Signed)
Stable on current dose of medications. Tolerating losartan in addition to her propranolol.

## 2016-04-27 ENCOUNTER — Other Ambulatory Visit: Payer: Self-pay | Admitting: Internal Medicine

## 2016-04-27 DIAGNOSIS — Z1231 Encounter for screening mammogram for malignant neoplasm of breast: Secondary | ICD-10-CM

## 2016-05-12 ENCOUNTER — Ambulatory Visit: Payer: Medicare Other

## 2016-05-20 ENCOUNTER — Other Ambulatory Visit: Payer: Self-pay | Admitting: Cardiology

## 2016-11-18 ENCOUNTER — Other Ambulatory Visit: Payer: Self-pay | Admitting: Internal Medicine

## 2016-11-18 DIAGNOSIS — Z1231 Encounter for screening mammogram for malignant neoplasm of breast: Secondary | ICD-10-CM

## 2016-12-06 ENCOUNTER — Encounter: Payer: Self-pay | Admitting: Cardiology

## 2016-12-06 ENCOUNTER — Ambulatory Visit (INDEPENDENT_AMBULATORY_CARE_PROVIDER_SITE_OTHER): Payer: Medicare Other | Admitting: Cardiology

## 2016-12-06 ENCOUNTER — Ambulatory Visit
Admission: RE | Admit: 2016-12-06 | Discharge: 2016-12-06 | Disposition: A | Discharge status: Home / Self Care | Payer: Medicare Other | Source: Ambulatory Visit | Attending: Cardiology | Admitting: Cardiology

## 2016-12-06 VITALS — BP 139/76 | HR 62 | Ht 62.0 in | Wt 237.0 lb

## 2016-12-06 DIAGNOSIS — E785 Hyperlipidemia, unspecified: Secondary | ICD-10-CM | POA: Diagnosis not present

## 2016-12-06 DIAGNOSIS — R079 Chest pain, unspecified: Secondary | ICD-10-CM | POA: Diagnosis not present

## 2016-12-06 DIAGNOSIS — R0609 Other forms of dyspnea: Secondary | ICD-10-CM

## 2016-12-06 DIAGNOSIS — I10 Essential (primary) hypertension: Secondary | ICD-10-CM | POA: Diagnosis not present

## 2016-12-06 DIAGNOSIS — R600 Localized edema: Secondary | ICD-10-CM

## 2016-12-06 DIAGNOSIS — Z6841 Body Mass Index (BMI) 40.0 and over, adult: Secondary | ICD-10-CM

## 2016-12-06 DIAGNOSIS — R0600 Dyspnea, unspecified: Secondary | ICD-10-CM | POA: Insufficient documentation

## 2016-12-06 NOTE — Assessment & Plan Note (Signed)
She seems to be making excuses for why she can't exercise. I talked her about doing water aerobics, and she mentions being scared of water. With her dizziness, I don't know of a safer way of exercising then in the water, but she doesn't seem to be interested in that.  We talked about dietary modification and figuring out some sort of exercise to do

## 2016-12-06 NOTE — Assessment & Plan Note (Addendum)
Her chest pain is somewhat atypical in that it radiates to the right side of the chest, and is somewhat reproducible, however she is very concerned, and does have cardiac risk factors. The concerning features that it is associated with dyspnea. Plan: Coronary CTA and echocardiogram, also will check d-dimer to exclude PE.  I am choosing to do a coronary CTA as opposed to nuclear stress test, because her last Myoview had "breast attenuation with anterior defect. I am leery of yet again having a false positive or potentially false negative result.

## 2016-12-06 NOTE — Assessment & Plan Note (Signed)
Blood pressure looks pretty good today. We have added losartan to propranolol, but she has not yet taken it today.

## 2016-12-06 NOTE — Assessment & Plan Note (Signed)
This is been a relatively frequent thing for her, but now seems to be worse. Recheck 2-D echocardiogram to exclude worsening EF. She doesn't like taking Lasix, but I think it is keeping her edema at bay. We'll check 2-D echocardiogram to exclude worsening EF.

## 2016-12-06 NOTE — Progress Notes (Signed)
PCP: Georgann Housekeeper, MD  Clinic Note: Chief Complaint  Patient presents with  . Follow-up    8 months; chest pain  . Shortness of Breath    since the 19th of this month; gasping for breath.    HPI: Rebecca Carey is a 76 y.o. female with a PMH below who presents today for a month follow-up with cardiac risk factors including hypertension and obesity as well as her history of nonischemic cardiomyopathy.  Her myopathy has subsequently resolved. She has had a negative Myoview as part of an ischemic evaluation for chest discomfort and palpitations.  Rebecca Carey was last seen on 03/16/2016 which was an 18 month follow up at that time. She is doing very well. She does have some balance issues secondary to vertigo and had few falls. Is also some possible orthostatic hypotension.   She had some palpitations.  Recent Hospitalizations:  None  Studies Personally Reviewed - (if available, images/films reviewed: From Epic Chart or Care Everywhere)   None  Interval History: Rebecca Carey presents today for sooner than usual follow-up after 2 episodes of "heart spells with shortness of breath and chest pain ". First episode was on June 19 while walking to the grocery store, she had sudden onset of substernal chest discomfort radiating up the right side of her lower rib cage that was associated with sensation of feeling hot and flushed as well as extreme shortness of breath. Since then she has noted difficulty with catching her breath. She had a similar episode 5 days later just not as long. She is noted some palpitations associated with these episodes, but is mostly now noticing being quite "breathy ". She is very concerned, she wants to be positive that she does not have congestive heart failure or any heart blockages. The chronic aching sensation that she has at baseline seems to have been exacerbated by this onset of symptoms. She says that since her symptoms began she has been noticing ankle  swelling, however there is no swelling today. She is very anxious about this. She has lots of social stress (discussing issues with her husband). She now notes with her "breathiness "that she will have to cough to catch her breath."  She also says that with those chest discomfort episodes she felt as though she needed to belch, but was not able to.    She does not  Want another pill,and does no like taking the last 2 medicines I gave her - losartan & lasix.  She asked the question of how she is post lose weight when she has poor balance and cannot walk very well. She doesn't eat poorly she says. She has lots of stress that makes it hard for her.  She says that she always sleeps sitting up, and cytosine of further now, but denies any PND symptoms.  No syncope/near syncope. No TIA/amaurosis fugax symptoms.. No claudication.  ROS: A comprehensive was performed. Review of Systems  Constitutional: Positive for malaise/fatigue. Negative for chills, fever and weight loss.  HENT: Negative for congestion and nosebleeds.   Respiratory: Positive for cough, shortness of breath and wheezing.   Cardiovascular:       Per history of present illness  Gastrointestinal: Negative for blood in stool, constipation and melena.  Genitourinary: Negative for hematuria.  Musculoskeletal: Positive for back pain and joint pain.  Neurological: Positive for dizziness. Negative for focal weakness.  Endo/Heme/Allergies: Negative for environmental allergies.  Psychiatric/Behavioral: The patient is nervous/anxious and has insomnia.   All  other systems reviewed and are negative.  I have reviewed and (if needed) personally updated the patient's problem list, medications, allergies, past medical and surgical history, social and family history.   Past Medical History:  Diagnosis Date  . Diabetes mellitus type 2 in obese (HCC)    Using "herbal therapy"  . Dyslipidemia   . Fatty liver   . HTN (hypertension)   .  Hypothyroidism   . Morbid obesity with BMI of 45.0-49.9, adult (HCC)    BMI ~45; 5'2" 242 lb    Past Surgical History:  Procedure Laterality Date  . DOPPLER ECHOCARDIOGRAPHY  09/20/2012   normal LV size and function with an EF of  . NM MYOVIEW LTD  11/06/2012   EF 71%.  No ischemia or infarction.  Anterior defect consistent with breast attenuation    Current Meds  Medication Sig  . ALPHA LIPOIC ACID PO Take 1 tablet by mouth daily.   Marland Kitchen ALPRAZolam (XANAX) 0.25 MG tablet Take 0.125 mg by mouth at bedtime as needed for anxiety.  . Ascorbic Acid (VITAMIN C PO) Take 1 tablet by mouth daily.  Marland Kitchen aspirin 81 MG tablet Take 81 mg by mouth daily.  . B-12, Methylcobalamin, 1000 MCG SUBL Place 1 tablet under the tongue daily.  Marland Kitchen BIOTIN PO Take 1 tablet by mouth daily.  . CHROMIUM PO Take 1 tablet by mouth daily.  . Coenzyme Q10 (COQ10) 100 MG CAPS Take 100 mg by mouth daily.  . furosemide (LASIX) 20 MG tablet Take 1 tablet (20 mg total) by mouth daily.  Marland Kitchen losartan (COZAAR) 25 MG tablet Take 1 tablet (25 mg total) by mouth daily.  . Multiple Vitamins-Minerals (MULTIVITAMIN PO) Take 1 tablet by mouth daily.  . Omega-3 Fatty Acids (FISH OIL PO) Take 1 capsule by mouth daily.  . ONE TOUCH ULTRA TEST test strip   . OVER THE COUNTER MEDICATION Take 1 capsule by mouth daily. Med Name: GYMNEMA SYLVERTRE LEAF  . OVER THE COUNTER MEDICATION Med Name: CalMax Mix one (1) packet into water to take by mouth daily at bedtime.  . propranolol (INDERAL) 20 MG tablet Take 1 tablet (20 mg total) by mouth 2 (two) times daily.  Marland Kitchen SYNTHROID 125 MCG tablet Take 125 mcg by mouth daily before breakfast.    Allergies  Allergen Reactions  . Codeine Nausea And Vomiting    unspecified  . Cortisone Other (See Comments)    Turns face and skin red, and warm. Makes heart beat out of chest   . Sulfa Antibiotics Other (See Comments)    Unspecified.     Social History   Social History  . Marital status: Married     Spouse name: N/A  . Number of children: N/A  . Years of education: N/A   Social History Main Topics  . Smoking status: Never Smoker  . Smokeless tobacco: Never Used  . Alcohol use No  . Drug use: No  . Sexual activity: Not Asked   Other Topics Concern  . None   Social History Narrative   She is a married mother of 1. She tries to walk every day, does not smoke and does not drink.      She has a significant family history coronary disease.    family history includes Arthritis in her mother and sister; Diabetes in her sister and sister; Heart attack in her father; Heart disease in her sister; Hypothyroidism in her mother and paternal grandfather; Mitral valve prolapse in her mother.  Wt Readings from Last 3 Encounters:  12/06/16 237 lb (107.5 kg)  03/16/16 233 lb 6.4 oz (105.9 kg)  02/04/15 229 lb (103.9 kg)    PHYSICAL EXAM BP 139/76   Pulse 62   Ht 5\' 2"  (1.575 m)   Wt 237 lb (107.5 kg)   BMI 43.35 kg/m  General appearance: alert, cooperative, appears stated age, no distress. Morbidly obese. HEENT: [place]/AT, EOMI, MMM, anicteric sclera Neck: no adenopathy, no carotid bruit with normal upstroke, and no JVD Lungs: clear to auscultation bilaterally, normal percussion bilaterally and non-labored Heart: regular rate and rhythm, S1 &S2 normal, no murmur, click, rub or gallop; unable to palpate PMI;  On palpation, he chest pain ws somewhat reproducible along the sternal order radiating along the lower ribs to the floating ribs Abdomen: soft, non-tender; bowel sounds normal; no masses;  No HSM, or HJR Extremities: extremities normal, atraumatic, no cyanosis; Edema: minimal none Pulses: 2+ and symmetric;  Skin: normal and no evidence of bleeding or bruising Neurologic: Mental status: Alert & oriented x 3, thought content appropriate; non-focal exam.  Pleasant mood & affect.    Adult ECG Report  Rate: 62 ;  Rhythm: normal sinus rhythm and Normal axis, intervals and durations;    Narrative Interpretation: Normal EKG   Other studies Reviewed: Additional studies/ records that were reviewed today include:  Recent Labs:  n/a  ASSESSMENT / PLAN: Problem List Items Addressed This Visit    Chest pain with moderate risk for cardiac etiology - Primary (Chronic)    Her chest pain is somewhat atypical in that it radiates to the right side of the chest, and is somewhat reproducible, however she is very concerned, and does have cardiac risk factors. The concerning features that it is associated with dyspnea. Plan: Coronary CTA and echocardiogram, also will check d-dimer to exclude PE.  I am choosing to do a coronary CTA as opposed to nuclear stress test, because her last Myoview had "breast attenuation with anterior defect. I am leery of yet again having a false positive or potentially false negative result.      Dyslipidemia (Chronic)   Edema of both legs (Chronic)    This is been a relatively frequent thing for her, but now seems to be worse. Recheck 2-D echocardiogram to exclude worsening EF. She doesn't like taking Lasix, but I think it is keeping her edema at bay. We'll check 2-D echocardiogram to exclude worsening EF.      Relevant Orders   EKG 12-Lead   D-dimer, quantitative (not [facility])   ECHOCARDIOGRAM COMPLETE   DG Chest 2 View (Completed)   Essential hypertension (Chronic)    Blood pressure looks pretty good today. We have added losartan to propranolol, but she has not yet taken it today.      Relevant Orders   EKG 12-Lead   Exertional dyspnea    Worsening exertional dyspnea since the onset of her chest discomfort. She is now describing some edema and worsening orthopnea as well. We need to exclude CHF given her history of cardiomyopathy. Also need to exclude PE. Since she is having some coughing associated with it, we will also check chest x-ray to exclude any bronchitis or pneumonia can be seen on x-ray. Nothing on exam.  She does agree to diastolic  dysfunction, however this wouldn't explain a sudden change in symptoms.  Plan: 2-D echo, chest x-ray, d-dimer and proBNP. We are also checking coronary CTA to exclude angina related to CAD.  Relevant Orders   EKG 12-Lead   Pro b natriuretic peptide (BNP)   D-dimer, quantitative (not at [facility])   CT CORONARY MORPH W/CTA COR W/SCORE W/CA W/CM &/OR WO/CM   CT CORONARY FRACTIONAL FLOW RESERVE DATA PREP   CT CORONARY FRACTIONAL FLOW RESERVE FLUID ANALYSIS   ECHOCARDIOGRAM COMPLETE   DG Chest 2 View (Completed)   Morbid obesity with BMI of 40.0-44.9, adult (HCC) (Chronic)    She seems to be making excuses for why she can't exercise. I talked her about doing water aerobics, and she mentions being scared of water. With her dizziness, I don't know of a safer way of exercising then in the water, but she doesn't seem to be interested in that.  We talked about dietary modification and figuring out some sort of exercise to do        Greater than 40 minutes was spent with the patient. >50% of the time was spent in direct consultation. Every time I thought we had completed a conversation, she had more points to bring up.  Current medicines are reviewed at length with the patient today. (+/- concerns) n/a The following changes have been made: n/a  Patient Instructions  SCHEDULE AT [address] Your physician has requested that you have an echocardiogram. Echocardiography is a painless test that uses sound waves to create images of your heart. It provides your doctor with information about the size and shape of your heart and how well your heart's chambers and valves are working. This procedure takes approximately one hour. There are no restrictions for this procedure.   Your physician has requested that you have cardiac CT. Cardiac computed tomography (CT) is a painless test that uses an x-ray machine to take clear, detailed pictures of your heart. For further information  please visit [email or web address] Please follow instruction sheet as given.    LABS -TODAY  PRO-BNP D-DIMER    PLEASE GO TO [address] FOR  PA AND LAT CHEST X-RAY TODAY   Your physician recommends that you schedule a follow-up appointment in 2 MONTHS WITH DR [facility] FOLLOWING TEST RESULTS      Studies Ordered:   Orders Placed This Encounter  Procedures  . CT CORONARY MORPH W/CTA COR W/SCORE W/CA W/CM &/OR WO/CM  . CT CORONARY FRACTIONAL FLOW RESERVE DATA PREP  . CT CORONARY FRACTIONAL FLOW RESERVE FLUID ANALYSIS  . DG Chest 2 View  . Pro b natriuretic peptide (BNP)  . D-dimer, quantitative (not at [facility])  . EKG 12-Lead  . ECHOCARDIOGRAM COMPLETE      Bryan Lemma, M.D., M.S. Interventional Cardiologist   Pager # [phone] Phone # [phone] [address] [place], [place] [address]

## 2016-12-06 NOTE — Assessment & Plan Note (Addendum)
Worsening exertional dyspnea since the onset of her chest discomfort. She is now describing some edema and worsening orthopnea as well. We need to exclude CHF given her history of cardiomyopathy. Also need to exclude PE. Since she is having some coughing associated with it, we will also check chest x-ray to exclude any bronchitis or pneumonia can be seen on x-ray. Nothing on exam.  She does agree to diastolic dysfunction, however this wouldn't explain a sudden change in symptoms.  Plan: 2-D echo, chest x-ray, d-dimer and proBNP. We are also checking coronary CTA to exclude angina related to CAD.

## 2016-12-06 NOTE — Patient Instructions (Signed)
SCHEDULE AT [address] Your physician has requested that you have an echocardiogram. Echocardiography is a painless test that uses sound waves to create images of your heart. It provides your doctor with information about the size and shape of your heart and how well your heart's chambers and valves are working. This procedure takes approximately one hour. There are no restrictions for this procedure.   Your physician has requested that you have cardiac CT. Cardiac computed tomography (CT) is a painless test that uses an x-ray machine to take clear, detailed pictures of your heart. For further information please visit [email or web address]. Please follow instruction sheet as given.    LABS -TODAY  PRO-BNP D-DIMER    PLEASE GO TO [address] FOR  PA AND LAT CHEST X-RAY TODAY   Your physician recommends that you schedule a follow-up appointment in 2 MON[facility]S [facility] FOLLOWING TEST RESULTS

## 2016-12-07 LAB — PRO B NATRIURETIC PEPTIDE: NT-Pro BNP: 162 pg/mL (ref 0–301)

## 2016-12-07 LAB — D-DIMER, QUANTITATIVE: D-DIMER: 0.53 mg/L FEU — ABNORMAL HIGH (ref 0.00–0.49)

## 2016-12-08 NOTE — Progress Notes (Signed)
Normal Chest Xray.  Rebecca Lemmaavid Bera Pinela, MD

## 2016-12-09 ENCOUNTER — Telehealth: Payer: Self-pay | Admitting: *Deleted

## 2016-12-09 DIAGNOSIS — R079 Chest pain, unspecified: Secondary | ICD-10-CM

## 2016-12-09 DIAGNOSIS — R7989 Other specified abnormal findings of blood chemistry: Secondary | ICD-10-CM

## 2016-12-09 DIAGNOSIS — R0609 Other forms of dyspnea: Principal | ICD-10-CM

## 2016-12-09 DIAGNOSIS — Z01818 Encounter for other preprocedural examination: Secondary | ICD-10-CM

## 2016-12-09 NOTE — Telephone Encounter (Signed)
-----   Message from Marykay Lexavid W Harding, MD sent at 12/09/2016  8:06 AM EDT ----- Pro-BNP level - normal - argues against CHF. D-dimer is just borderline high - probably not consistent with a pulmonary embolus, but given the new onset of symptoms come for completion sake, we can check a CTA of the chest to exclude PE.  Bryan Lemmaavid Harding, MD

## 2016-12-09 NOTE — Telephone Encounter (Signed)
Spoke to patient. Result given . Verbalized understanding Patient aware ct has been ordered

## 2016-12-09 NOTE — Telephone Encounter (Signed)
-----   Message from Marykay Lexavid W Harding, MD sent at 12/08/2016  9:37 PM EDT ----- Normal CXR DH ----- Message ----- From: Abelino DerrickKilroy, Luke K, PA-C Sent: 12/06/2016   1:56 PM To: Marykay Lexavid W Harding, MD  I think this was ment for you  Corine ShelterLUKE KILROY PA-C 12/06/2016 1:56 PM

## 2016-12-11 HISTORY — PX: TRANSTHORACIC ECHOCARDIOGRAM: SHX275

## 2016-12-12 ENCOUNTER — Ambulatory Visit
Admission: RE | Admit: 2016-12-12 | Discharge: 2016-12-12 | Disposition: A | Discharge status: Home / Self Care | Payer: Medicare Other | Source: Ambulatory Visit | Attending: Cardiology | Admitting: Cardiology

## 2016-12-12 ENCOUNTER — Other Ambulatory Visit: Payer: Self-pay | Admitting: *Deleted

## 2016-12-12 ENCOUNTER — Ambulatory Visit: Payer: Medicare Other | Admitting: Cardiology

## 2016-12-12 ENCOUNTER — Telehealth: Payer: Self-pay | Admitting: Cardiology

## 2016-12-12 DIAGNOSIS — R079 Chest pain, unspecified: Secondary | ICD-10-CM

## 2016-12-12 DIAGNOSIS — Z01818 Encounter for other preprocedural examination: Secondary | ICD-10-CM

## 2016-12-12 DIAGNOSIS — R0609 Other forms of dyspnea: Secondary | ICD-10-CM

## 2016-12-12 DIAGNOSIS — R7989 Other specified abnormal findings of blood chemistry: Secondary | ICD-10-CM

## 2016-12-12 DIAGNOSIS — R9389 Abnormal findings on diagnostic imaging of other specified body structures: Secondary | ICD-10-CM

## 2016-12-12 LAB — CREATININE, SERUM
Creatinine, Ser: 0.53 mg/dL — ABNORMAL LOW (ref 0.57–1.00)
GFR calc Af Amer: 109 mL/min/{1.73_m2} (ref >59)
GFR calc non Af Amer: 94 mL/min/{1.73_m2} (ref >59)

## 2016-12-12 LAB — BUN: BUN: 13 mg/dL (ref 8–27)

## 2016-12-12 MED ORDER — IOPAMIDOL (ISOVUE-370) INJECTION 76%
100.0000 mL | Freq: Once | INTRAVENOUS | Status: AC | PRN
  Administered 2016-12-12: 100 mL via INTRAVENOUS

## 2016-12-12 NOTE — Telephone Encounter (Signed)
[facility] to confirm [facility] of chest.Sharon Daphine DeutscherMartin RN verified order for CT of chest for PE.

## 2016-12-12 NOTE — Telephone Encounter (Signed)
New message    Calling in report for test

## 2016-12-12 NOTE — Telephone Encounter (Signed)
Spoke to patient. Result given. Verbalized understanding.aware will schedule  ----ORDER PLACED NON CHEST CT -  FOR 04/14/17

## 2016-12-12 NOTE — Telephone Encounter (Signed)
Late entry   report reviewed by Dr Herbie BaltimoreHARDING.

## 2016-12-12 NOTE — Telephone Encounter (Signed)
Ordered bun, creat. For schedule CT SCAN -R/O PE

## 2016-12-12 NOTE — Telephone Encounter (Signed)
Spoke with patient regarding CTA chest --PE protocol-chest x-ray and lab work.  Patient needsto have BUN & Creatinie prior to CTA scan--she voiced her understanding.  Pt was informed she could come to our office or go to [facility] on [address] .  CTA is scheduled as a walk in  [facility] [address]--patient was told to arrive no later than 2:30pm this afternoon.  She voiced her understanding.

## 2016-12-20 ENCOUNTER — Other Ambulatory Visit: Payer: Medicare Other

## 2016-12-26 ENCOUNTER — Ambulatory Visit
Admission: RE | Admit: 2016-12-26 | Discharge: 2016-12-26 | Disposition: A | Discharge status: Home / Self Care | Payer: Medicare Other | Source: Ambulatory Visit | Attending: Internal Medicine | Admitting: Internal Medicine

## 2016-12-26 DIAGNOSIS — Z1231 Encounter for screening mammogram for malignant neoplasm of breast: Secondary | ICD-10-CM

## 2016-12-27 ENCOUNTER — Ambulatory Visit (HOSPITAL_COMMUNITY): Payer: Medicare Other | Attending: Cardiology

## 2016-12-27 ENCOUNTER — Other Ambulatory Visit: Payer: Self-pay

## 2016-12-27 ENCOUNTER — Telehealth: Payer: Self-pay | Admitting: *Deleted

## 2016-12-27 DIAGNOSIS — R0609 Other forms of dyspnea: Secondary | ICD-10-CM

## 2016-12-27 DIAGNOSIS — I5030 Unspecified diastolic (congestive) heart failure: Secondary | ICD-10-CM | POA: Diagnosis not present

## 2016-12-27 DIAGNOSIS — R600 Localized edema: Secondary | ICD-10-CM

## 2016-12-27 DIAGNOSIS — R079 Chest pain, unspecified: Secondary | ICD-10-CM | POA: Diagnosis not present

## 2016-12-27 DIAGNOSIS — I051 Rheumatic mitral insufficiency: Secondary | ICD-10-CM | POA: Insufficient documentation

## 2016-12-27 NOTE — Telephone Encounter (Addendum)
-----   Message from Marykay Lexavid W Harding, MD sent at 12/27/2016  2:27 PM EDT ----- Echocardiogram results:   Normal left ventricle size and function with ejection fraction of 55-60% being in the middle of the normal range. The does appear to be abnormal relaxation with grade 2 diastolic dysfunction -> suggest the possibility of diastolic dysfunction related shortness of breath with exertion. Treatment here is blood pressure control and considering diuretic/fluid pill. No valve lesions.  -We can discuss what this means in follow-up.  Bryan Lemmaavid Harding, MD   Left message for pt to call

## 2017-01-12 NOTE — Telephone Encounter (Signed)
pt aware of results  Follow up scheduled  

## 2017-01-14 ENCOUNTER — Other Ambulatory Visit: Payer: Self-pay | Admitting: Cardiology

## 2017-01-16 NOTE — Telephone Encounter (Signed)
REFILL 

## 2017-02-06 ENCOUNTER — Ambulatory Visit: Payer: Medicare Other | Admitting: Cardiology

## 2017-02-14 ENCOUNTER — Ambulatory Visit: Payer: Medicare Other | Admitting: Cardiology

## 2017-02-21 ENCOUNTER — Encounter: Payer: Self-pay | Admitting: Cardiology

## 2017-02-21 ENCOUNTER — Ambulatory Visit (INDEPENDENT_AMBULATORY_CARE_PROVIDER_SITE_OTHER): Payer: Medicare Other | Admitting: Cardiology

## 2017-02-21 VITALS — BP 138/78 | HR 70 | Ht 62.0 in | Wt 240.2 lb

## 2017-02-21 DIAGNOSIS — R0609 Other forms of dyspnea: Secondary | ICD-10-CM

## 2017-02-21 DIAGNOSIS — R918 Other nonspecific abnormal finding of lung field: Secondary | ICD-10-CM | POA: Diagnosis not present

## 2017-02-21 DIAGNOSIS — E785 Hyperlipidemia, unspecified: Secondary | ICD-10-CM | POA: Diagnosis not present

## 2017-02-21 DIAGNOSIS — I10 Essential (primary) hypertension: Secondary | ICD-10-CM

## 2017-02-21 DIAGNOSIS — R600 Localized edema: Secondary | ICD-10-CM

## 2017-02-21 DIAGNOSIS — R079 Chest pain, unspecified: Secondary | ICD-10-CM

## 2017-02-21 DIAGNOSIS — Z6841 Body Mass Index (BMI) 40.0 and over, adult: Secondary | ICD-10-CM | POA: Diagnosis not present

## 2017-02-21 NOTE — Progress Notes (Signed)
PCP: Georgann Housekeeper, MD  Clinic Note: Chief Complaint  Patient presents with  . Follow-up    Heart Carey, chest discomfort and shortness of breath    HPI: Rebecca Carey is a 76 y.o. female with a PMH below who presents today for three-month follow-up with chest pain and shortness of breath. She has been followed for hypertension and obesity and has had a history of nonischemic cardiac myopathy that is resolved. She had negative Myoview in the past. .  Rebecca Carey was last seen on 12/06/2016: She noted an episode back in 19 when she had profound dyspnea which is gasping for breath. She described as a "heart spell. She said she had to episodes. The first was on June 19 walking to the grocery store where she had sudden onset substernal chest discomfort radiating to the right side cage with a feeling of flushed hot sensation of shortness of breath. A similar episode last lasting less time occurred 5 days later.  Recent Hospitalizations: None  Studies Personally Reviewed - (if available, images/films reviewed: From Epic Chart or Care Everywhere)  2-D echo 12/27/2016: Normal LV size and function. EF 55-60%. GR 2 DD.no notable valvular issues  Chest CTA 12/12/2016: Diffuse thoracic aortic calcification without dilation or dissection. Coronary calcification noted. No PE. Scattered small mediastinal lymph nodes noted.  Interval History:  Jorryn presents to again today still noticing a little chest day or quivering sensation. She still notes the pressure first. After a few seconds symptoms will start to abate. She has not had any significant episodes like she had prior to her last visit, but still has sensation of feeling "breathy and with palpitations doing just that any definite activity. She overall says worse exercise tolerance than she had before. Still mild lower extremity swelling. She remains very anxious and like to get an answer her symptoms. She really denies any exertional  chest discomfort. She has intermittent palpitations, but they're not prolonged. Most of what she notes is the exertional dyspnea with a mild aching sensation in her chest. She has mild length ankle edema but no real PND or orthopnea. No syncope/near severe TIA/amaurosis fugax.  No melena, hematochezia, hematuria, or epstaxis. No claudication.  Again she still having a hard time trying to lose weight because she has poor balance and has difficulty walking.  ROS: A comprehensive was performed. Review of Systems  Constitutional: Positive for malaise/fatigue (Exercise intolerance).  HENT: Negative for congestion and nosebleeds.   Respiratory: Positive for cough, shortness of breath (With exertion) and wheezing.        Symptoms beyond dyspnea occur with environmental allergies  Cardiovascular: Positive for leg swelling (Off-and-on).  Gastrointestinal: Negative for blood in stool and melena.  Genitourinary: Negative for dysuria and hematuria.  Musculoskeletal: Positive for back pain and joint pain.  Endo/Heme/Allergies: Positive for environmental allergies.  Psychiatric/Behavioral: The patient is nervous/anxious and has insomnia.   All other systems reviewed and are negative.  I have reviewed and (if needed) personally updated the patient's problem list, medications, allergies, past medical and surgical history, social and family history.   Past Medical History:  Diagnosis Date  . Diabetes mellitus type 2 in obese (HCC)    Using "herbal therapy"  . Dyslipidemia   . Fatty liver   . HTN (hypertension)   . Hypothyroidism   . Morbid obesity with BMI of 45.0-49.9, adult (HCC)    BMI ~45; 5'2" 242 lb    Past Surgical History:  Procedure Laterality Date  .  NM MYOVIEW LTD  11/06/2012   EF 71%.  No ischemia or infarction.  Anterior defect consistent with breast attenuation  . TRANSTHORACIC ECHOCARDIOGRAM  09/20/2012   normal LV size and function with normal EF  . TRANSTHORACIC  ECHOCARDIOGRAM  12/2016   Normal LV size and function with EF 55-60%. GR 2 DD. No visible valvular lesions.    Current Meds  Medication Sig  . ALPHA LIPOIC ACID PO Take 1 tablet by mouth daily.   Marland Kitchen. ALPRAZolam (XANAX) 0.25 MG tablet Take 0.125 mg by mouth at bedtime as needed for anxiety.  . Ascorbic Acid (VITAMIN C PO) Take 1 tablet by mouth daily.  Marland Kitchen. aspirin 81 MG tablet Take 81 mg by mouth daily.  . B-12, Methylcobalamin, 1000 MCG SUBL Place 1 tablet under the tongue daily.  Marland Kitchen. BIOTIN PO Take 1 tablet by mouth daily.  . CHROMIUM PO Take 1 tablet by mouth 2 (two) times daily.   . Coenzyme Q10 (COQ10) 100 MG CAPS Take 100 mg by mouth daily.  . furosemide (LASIX) 20 MG tablet Take 1 tablet (20 mg total) by mouth daily.  Marland Kitchen. losartan (COZAAR) 25 MG tablet Take 1 tablet (25 mg total) by mouth daily.  . Multiple Vitamins-Minerals (MULTIVITAMIN PO) Take 1 tablet by mouth daily.  . Omega-3 Fatty Acids (FISH OIL PO) Take 1 capsule by mouth daily.  . ONE TOUCH ULTRA TEST test strip   . OVER THE COUNTER MEDICATION Take 1 capsule by mouth daily. Med Name: GYMNEMA SYLVERTRE LEAF  . OVER THE COUNTER MEDICATION Med Name: CalMax Mix one (1) packet into water to take by mouth daily at bedtime.  . propranolol (INDERAL) 20 MG tablet Take 1 tablet (20 mg total) by mouth 2 (two) times daily.  Marland Kitchen. SYNTHROID 125 MCG tablet Take 125 mcg by mouth daily before breakfast.    Allergies  Allergen Reactions  . Codeine Nausea And Vomiting    unspecified  . Cortisone Other (See Comments)    Turns face and skin red, and warm. Makes heart beat out of chest   . Sulfa Antibiotics Other (See Comments)    Unspecified.     Social History   Social History  . Marital status: Married    Spouse name: N/A  . Number of children: N/A  . Years of education: N/A   Social History Main Topics  . Smoking status: Never Smoker  . Smokeless tobacco: Never Used  . Alcohol use No  . Drug use: No  . Sexual activity: Not  Asked   Other Topics Concern  . None   Social History Narrative   She is a married mother of 1. She tries to walk every day, does not smoke and does not drink.      She has a significant family history coronary disease.    family history includes Arthritis in her mother and sister; Diabetes in her sister and sister; Heart attack in her father; Heart disease in her sister; Hypothyroidism in her mother and paternal grandfather; Mitral valve prolapse in her mother.  Wt Readings from Last 3 Encounters:  02/21/17 240 lb 3.2 oz (109 kg)  12/06/16 237 lb (107.5 kg)  03/16/16 233 lb 6.4 oz (105.9 kg)    PHYSICAL EXAM BP 138/78   Pulse 70   Ht 5\' 2"  (1.575 m)   Wt 240 lb 3.2 oz (109 kg)   SpO2 97%   BMI 43.93 kg/m  Physical Exam  Constitutional: She is oriented to person, place,  and time. She appears well-developed and well-nourished. No distress.  Obese. Deconditioned. Well-groomed  HENT:  Head: Normocephalic and atraumatic.  Eyes: EOM are normal. No scleral icterus.  Neck: Normal range of motion. Neck supple. No JVD present. No tracheal deviation present.  Cardiovascular: Normal rate, regular rhythm and normal heart sounds.  Exam reveals no gallop and no friction rub.   Pulmonary/Chest: Effort normal and breath sounds normal. No respiratory distress. She has no wheezes. She has no rales. She exhibits tenderness.  Interstitial sounds  Abdominal: Soft. Bowel sounds are normal. She exhibits no distension. There is no tenderness. There is no rebound and no guarding.  Musculoskeletal: Normal range of motion. She exhibits edema (Trace bilateral).  Neurological: She is alert and oriented to person, place, and time. No cranial nerve deficit.  Skin: Skin is warm and dry.  Psychiatric: She has a normal mood and affect. Her behavior is normal. Judgment and thought content normal.  Nursing note and vitals reviewed.    Adult ECG Report None  Other studies Reviewed: Additional studies/  records that were reviewed today include:  Recent Labs:  No results found for: CHOL, HDL, LDLCALC, LDLDIRECT, TRIG, CHOLHDL Lab Results  Component Value Date   CREATININE 0.53 (L) 12/12/2016   BUN 13 12/12/2016   NA 138 07/13/2014   K 3.8 07/13/2014   CL 105 07/13/2014   CO2 25 07/13/2014     ASSESSMENT / PLAN: Problem List Items Addressed This Visit    Chest pain with moderate risk for cardiac etiology - Primary (Chronic)    As per my last note, I want to do coronary CT angiogram, but she did not have this done. I also ordered an echocardiogram which was done. The echocardiogram suggests maybe grade 2 diastolic dysfunction which could explain dyspnea but none so chest discomfort. My initial order was misunderstood and she had a chest CTA to exclude PE as opposed to a d-dimer. Intention was coronary calcium score and CTA. Now we have a chest CT scan which showed some pulmonary nodules. These can be followed up by a coronary CT angiogram next month which would be a 3 month interval. Reluctant to use Myoview given her prior history of breast attenuation with anterior infarct/defect..  She is on aspirin, ARB beta blocker but not statin as previously indicated.      Relevant Orders   CT CORONARY MORPH W/CTA COR W/SCORE W/CA W/CM &/OR WO/CM   CT CORONARY FRACTIONAL FLOW RESERVE DATA PREP   CT CORONARY FRACTIONAL FLOW RESERVE FLUID ANALYSIS   Dyslipidemia (Chronic)    On omega-3 fatty acids and CoQ10. Reluctant to go on statin. Pending results of her coronary CTA, may want to be more aggressive.      Edema of both legs (Chronic)    She has chronic lower TIMI edema and has had not a lot of evidence to suggest this cardiac in nature. She does not like taking Lasix slightly recommended she take it as a when necessary. EF seems preserved.      Essential hypertension (Chronic)   Exertional dyspnea    She had some episodes of chest discomfort now continues to have exertional dyspnea. Her  chest CTA did not show any PE or significant pulmonary findings. Plan now will be to do ordering CT angiogram with coronary calcium score to exclude CAD.  Echocardiogram really showed normal function with grade 2 diastolic function was does not explain extent of dyspnea she is noticing.      Relevant  Orders   CT CORONARY MORPH W/CTA COR W/SCORE W/CA W/CM &/OR WO/CM   CT CORONARY FRACTIONAL FLOW RESERVE DATA PREP   CT CORONARY FRACTIONAL FLOW RESERVE FLUID ANALYSIS   Morbid obesity with BMI of 40.0-44.9, adult (HCC) (Chronic)    Simply needs to stop making excuses and exercise. I think if we can exclude ischemia from a cardiac standpoint she could then be persuaded to safely work on her weight loss and exercise.  Again reiterated the concept of water aerobics.      Pulmonary nodules   Relevant Orders   CT CORONARY MORPH W/CTA COR W/SCORE W/CA W/CM &/OR WO/CM   CT CORONARY FRACTIONAL FLOW RESERVE DATA PREP   CT CORONARY FRACTIONAL FLOW RESERVE FLUID ANALYSIS      Current medicines are reviewed at length with the patient today. (+/- concerns) n/a The following changes have been made: see instructions  Patient Instructions  SCHEDULE AT [facility] - OCT 2018 Your physician has requested that you have cardiac CT. Cardiac computed tomography (CT) is a painless test that uses an x-ray machine to take clear, detailed pictures of your heart. For further information please visit [email or web address] Please follow instruction sheet as given.   Your physician recommends that you schedule a follow-up appointment in Nov 2018 WITH DR [facility]    Studies Ordered:   Orders Placed This Encounter  Procedures  . CT CORONARY MORPH W/CTA COR W/SCORE W/CA W/CM &/OR WO/CM  . CT CORONARY FRACTIONAL FLOW RESERVE DATA PREP  . CT CORONARY FRACTIONAL FLOW RESERVE FLUID ANALYSIS      Bryan Lemma, M.D., M.S. Interventional Cardiologist   Pager # [phone] Phone # [phone] [address] [place], [place] [address]

## 2017-02-21 NOTE — Patient Instructions (Signed)
SCHEDULE AT [facility] - OCT 2018 Your physician has requested that you have cardiac CT. Cardiac computed tomography (CT) is a painless test that uses an x-ray machine to take clear, detailed pictures of your heart. For further information please visit [email or web address]. Please follow instruction sheet as given.   Your physician recommends that you schedule a follow-up appointment in Nov 2018 [facility].

## 2017-02-23 ENCOUNTER — Encounter: Payer: Self-pay | Admitting: Cardiology

## 2017-02-23 NOTE — Assessment & Plan Note (Signed)
Simply needs to stop making excuses and exercise. I think if we can exclude ischemia from a cardiac standpoint she could then be persuaded to safely work on her weight loss and exercise.  Again reiterated the concept of water aerobics.

## 2017-02-23 NOTE — Assessment & Plan Note (Signed)
As per my last note, I want to do coronary CT angiogram, but she did not have this done. I also ordered an echocardiogram which was done. The echocardiogram suggests maybe grade 2 diastolic dysfunction which could explain dyspnea but none so chest discomfort. My initial order was misunderstood and she had a chest CTA to exclude PE as opposed to a d-dimer. Intention was coronary calcium score and CTA. Now we have a chest CT scan which showed some pulmonary nodules. These can be followed up by a coronary CT angiogram next month which would be a 3 month interval. Reluctant to use Myoview given her prior history of breast attenuation with anterior infarct/defect..  She is on aspirin, ARB beta blocker but not statin as previously indicated.

## 2017-02-23 NOTE — Assessment & Plan Note (Signed)
She had some episodes of chest discomfort now continues to have exertional dyspnea. Her chest CTA did not show any PE or significant pulmonary findings. Plan now will be to do ordering CT angiogram with coronary calcium score to exclude CAD.  Echocardiogram really showed normal function with grade 2 diastolic function was does not explain extent of dyspnea she is noticing.

## 2017-02-23 NOTE — Assessment & Plan Note (Signed)
She has chronic lower TIMI edema and has had not a lot of evidence to suggest this cardiac in nature. She does not like taking Lasix slightly recommended she take it as a when necessary. EF seems preserved.

## 2017-02-23 NOTE — Assessment & Plan Note (Signed)
On omega-3 fatty acids and CoQ10. Reluctant to go on statin. Pending results of her coronary CTA, may want to be more aggressive.

## 2017-03-13 ENCOUNTER — Other Ambulatory Visit: Payer: Self-pay | Admitting: Cardiology

## 2017-04-14 ENCOUNTER — Telehealth: Payer: Self-pay | Admitting: Cardiology

## 2017-04-14 NOTE — Telephone Encounter (Signed)
Message routed to [facility] and Emergency planning/management officerharon RN

## 2017-04-14 NOTE — Telephone Encounter (Signed)
New Message     Pt cancelled appt with Dr Herbie BaltimoreHarding and wants to hold off on having the CT testing until after the first of them year, she just feels she cant handle anymore bad news right now.   Please call her after the holidays to get this rescheduled

## 2017-04-19 ENCOUNTER — Ambulatory Visit: Payer: Medicare Other | Admitting: Cardiology

## 2017-04-19 NOTE — Telephone Encounter (Signed)
PATIENT HAS BEEN PLACED ON RECALL FOR JAN 2019  BY SCHEDULERS

## 2017-05-08 ENCOUNTER — Other Ambulatory Visit: Payer: Self-pay | Admitting: Cardiology

## 2017-05-11 ENCOUNTER — Encounter: Payer: Self-pay | Admitting: Cardiology

## 2017-06-16 ENCOUNTER — Other Ambulatory Visit: Payer: Self-pay | Admitting: Cardiology

## 2017-06-26 ENCOUNTER — Telehealth: Payer: Self-pay | Admitting: Cardiology

## 2017-06-26 MED ORDER — PROPRANOLOL HCL 20 MG PO TABS: 20.0000 mg | ORAL_TABLET | Freq: Two times a day (BID) | ORAL | 3 refills | Status: DC

## 2017-06-26 MED ORDER — LOSARTAN POTASSIUM 25 MG PO TABS: 25.0000 mg | ORAL_TABLET | Freq: Every day | ORAL | 3 refills | Status: DC

## 2017-06-26 NOTE — Telephone Encounter (Signed)
Returned the call to the patient. She stated that she needs a refill on her medication. 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 He is currently in bad health. Losartan and propranolol have been filled for her.

## 2017-06-26 NOTE — Telephone Encounter (Signed)
Rebecca Carey is calling because she wants to speak to you about why she is unable to get her bp medication filled for a 90 day supply as well as why she is unable to do the heart scan at this time . Please call

## 2017-08-25 ENCOUNTER — Other Ambulatory Visit: Payer: Self-pay | Admitting: Cardiology

## 2017-08-29 ENCOUNTER — Telehealth: Payer: Self-pay | Admitting: Cardiology

## 2017-08-29 NOTE — Telephone Encounter (Signed)
Returned call to patient who states she received a letter from Gavin PoundDeborah about scheduling a cardiac CT in Nov - this was ordered by MD in Sept 2018. She states she has been putting off scheduling this test/MD follow up since she has been caring for her husband who has vision issues and she has no one to take her to a testing appt as her daughter, who lives across town, has her hands tied up with her kids. She also reports she has only been given about a 1 month supply of her meds with instructions to make an appointment. Advised it would be best to schedule the CT first and then follow up with MD. Will route message to Deborah/scheduler and Lamonte RicherSharon RN. Patient states she CANNOT come in for any appointments the last week of March, first week of April, and the week of Easter (prefers NOT to have test prior to Easter as she does not want to ruin her Easter).

## 2017-08-29 NOTE — Telephone Encounter (Signed)
New Message   Pt would like to ask questions before scheduling her an appt. Please call

## 2017-10-20 ENCOUNTER — Other Ambulatory Visit: Payer: Self-pay | Admitting: Cardiology

## 2017-10-20 NOTE — Telephone Encounter (Signed)
Rx(s) sent to pharmacy electronically.  

## 2017-11-13 ENCOUNTER — Encounter: Payer: Self-pay | Admitting: Cardiology

## 2017-11-13 ENCOUNTER — Ambulatory Visit: Payer: Medicare Other | Admitting: Cardiology

## 2017-11-13 VITALS — BP 168/84 | HR 66 | Ht 62.0 in | Wt 237.2 lb

## 2017-11-13 DIAGNOSIS — R079 Chest pain, unspecified: Secondary | ICD-10-CM | POA: Diagnosis not present

## 2017-11-13 DIAGNOSIS — R0609 Other forms of dyspnea: Secondary | ICD-10-CM | POA: Diagnosis not present

## 2017-11-13 DIAGNOSIS — R918 Other nonspecific abnormal finding of lung field: Secondary | ICD-10-CM

## 2017-11-13 DIAGNOSIS — Z6841 Body Mass Index (BMI) 40.0 and over, adult: Secondary | ICD-10-CM

## 2017-11-13 DIAGNOSIS — I10 Essential (primary) hypertension: Secondary | ICD-10-CM

## 2017-11-13 DIAGNOSIS — R600 Localized edema: Secondary | ICD-10-CM

## 2017-11-13 DIAGNOSIS — E785 Hyperlipidemia, unspecified: Secondary | ICD-10-CM

## 2017-11-13 MED ORDER — METOPROLOL TARTRATE 50 MG PO TABS: 50.0000 mg | ORAL_TABLET | Freq: Once | ORAL | 0 refills | Status: DC

## 2017-11-13 NOTE — Progress Notes (Signed)
PCP: Georgann Housekeeper, MD  Clinic Note: Chief Complaint  Patient presents with  . Follow-up    Positive review of symptoms    HPI: Rebecca Carey is a 77 y.o. female with a PMH below who presents today for three-month follow-up with chest pain and shortness of breath. She has been followed for hypertension and obesity and has had a history of nonischemic cardiac myopathy that is resolved. She had negative Myoview in the past. .  Rebecca Carey was last seen  In Sept  2018: At this time, she was still noticing these prolonged episodes of chest discomfort quivering.  Sometimes episodes last a few seconds sometimes it lasts all day.  She can feel breathy short of breath doing anything.  Very emotionally labile.  Certainly dealing with lots of stress at home.  We ordered a coronary CT angiogram, this never was done due to scheduling issues.  Recent Hospitalizations: None  Studies Personally Reviewed - (if available, images/films reviewed: From Epic Chart or Care Everywhere)     Interval History:  Rebecca Carey presents today again feeling the same episodes of heart pain but lasts all day long.  She still has all of the "symptoms of a heart attack "however they last all day.  She is complaining about pain in several parts of her chest there sharp and stabbing but also pressure and squeezing.  She has intermittent palpitations.  She has breathiness dyspnea at rest as well as exertion.  She says his episodes can last all day.  She has no energy.  Simply under somewhat stress that she cannot relax caring for her husband and was unable to make time for herself to get her test done. She very much wants to be the test done, but is just having a hard time coming to grips with scheduling.  She does not lie down flat therefore try to tell if she has any PND orthopnea.  She says she has mild edema. No syncope/near syncope or TIA/amaurosis fugax.  She has lots of musculoskeletal and neurologic symptoms  and make it hard for her getting exercise.  She has poor balance of just has a hard time walking.  She therefore still has a hard time losing weight.  Thankfully, she has not gained weight.   ROS: A comprehensive was performed. Review of Systems  Constitutional: Positive for malaise/fatigue (Exercise intolerance).  HENT: Negative for congestion and nosebleeds.   Respiratory: Positive for cough, shortness of breath (With exertion) and wheezing.        Symptoms beyond dyspnea occur with environmental allergies  Cardiovascular: Positive for leg swelling (Off-and-on).  Gastrointestinal: Negative for blood in stool and melena.  Genitourinary: Negative for dysuria and hematuria.  Musculoskeletal: Positive for back pain, joint pain and myalgias.  Neurological: Positive for dizziness, weakness and headaches.  Endo/Heme/Allergies: Positive for environmental allergies.  Psychiatric/Behavioral: Positive for depression. The patient is nervous/anxious and has insomnia.        Very stressed out.  Dealing with issues with her husband and not getting time for herself.  All other systems reviewed and are negative.  I have reviewed and (if needed) personally updated the patient's problem list, medications, allergies, past medical and surgical history, social and family history.   Past Medical History:  Diagnosis Date  . Diabetes mellitus type 2 in obese (HCC)    Using "herbal therapy"  . Dyslipidemia   . Fatty liver   . HTN (hypertension)   . Hypothyroidism   . Morbid  obesity with BMI of 45.0-49.9, adult (HCC)    BMI ~45; 5'2" 242 lb    Past Surgical History:  Procedure Laterality Date  . NM MYOVIEW LTD  11/06/2012   EF 71%.  No ischemia or infarction.  Anterior defect consistent with breast attenuation  . TRANSTHORACIC ECHOCARDIOGRAM  09/20/2012   normal LV size and function with normal EF  . TRANSTHORACIC ECHOCARDIOGRAM  12/2016   Normal LV size and function with EF 55-60%. GR 2 DD. No  visible valvular lesions.    Chest CTA 12/12/2016: Diffuse thoracic aortic calcification without dilation or dissection. Coronary calcification noted. No PE. Scattered small mediastinal lymph nodes noted.  Current Meds  Medication Sig  . ALPHA LIPOIC ACID PO Take 1 tablet by mouth daily.   Marland Kitchen ALPRAZolam (XANAX) 0.25 MG tablet Take 0.125 mg by mouth at bedtime as needed for anxiety.  . Ascorbic Acid (VITAMIN C PO) Take 1 tablet by mouth daily.  Marland Kitchen aspirin 81 MG tablet Take 81 mg by mouth daily.  . B-12, Methylcobalamin, 1000 MCG SUBL Place 1 tablet under the tongue daily.  Marland Kitchen BIOTIN PO Take 1 tablet by mouth daily.  . CHROMIUM PO Take 1 tablet by mouth 2 (two) times daily.   . Coenzyme Q10 (COQ10) 100 MG CAPS Take 100 mg by mouth daily.  . furosemide (LASIX) 20 MG tablet Take 1 tablet (20 mg total) by mouth daily.  . furosemide (LASIX) 20 MG tablet Take 1 tablet (20 mg total) by mouth daily.  Marland Kitchen losartan (COZAAR) 25 MG tablet Take 1 tablet (25 mg total) by mouth daily.  . Multiple Vitamins-Minerals (MULTIVITAMIN PO) Take 1 tablet by mouth daily.  . Omega-3 Fatty Acids (FISH OIL PO) Take 1 capsule by mouth daily.  . ONE TOUCH ULTRA TEST test strip   . OVER THE COUNTER MEDICATION Take 1 capsule by mouth daily. Med Name: GYMNEMA SYLVERTRE LEAF  . OVER THE COUNTER MEDICATION Med Name: CalMax Mix one (1) packet into water to take by mouth daily at bedtime.  . propranolol (INDERAL) 20 MG tablet Take 1 tablet (20 mg total) by mouth 2 (two) times daily.  Marland Kitchen SYNTHROID 125 MCG tablet Take 125 mcg by mouth daily before breakfast.    Allergies  Allergen Reactions  . Codeine Nausea And Vomiting    unspecified  . Cortisone Other (See Comments)    Turns face and skin red, and warm. Makes heart beat out of chest   . Sulfa Antibiotics Other (See Comments)    Unspecified.    Social History   Tobacco Use  . Smoking status: Never Smoker  . Smokeless tobacco: Never Used  Substance Use Topics  .  Alcohol use: No  . Drug use: No   Social History   Social History Narrative   She is a married mother of 1. She tries to walk every day, does not smoke and does not drink.      She has a significant family history coronary disease.     family history includes Arthritis in her mother and sister; Diabetes in her sister and sister; Heart attack in her father; Heart disease in her sister; Hypothyroidism in her mother and paternal grandfather; Mitral valve prolapse in her mother.  Wt Readings from Last 3 Encounters:  11/13/17 237 lb 3.2 oz (107.6 kg)  02/21/17 240 lb 3.2 oz (109 kg)  12/06/16 237 lb (107.5 kg)    PHYSICAL EXAM BP (!) 168/84   Pulse 66   Ht 5'  2" (1.575 m)   Wt 237 lb 3.2 oz (107.6 kg)   BMI 43.38 kg/m  Physical Exam  Constitutional: She is oriented to person, place, and time. She appears well-developed and well-nourished. No distress.  Obese. Deconditioned. Well-groomed  HENT:  Head: Normocephalic and atraumatic.  Eyes: EOM are normal. No scleral icterus.  Neck: Normal range of motion. Neck supple. No JVD present. No tracheal deviation present.  Cardiovascular: Normal rate, regular rhythm and normal heart sounds. Exam reveals no gallop and no friction rub.  Pulmonary/Chest: Effort normal and breath sounds normal. No respiratory distress. She has no wheezes. She has no rales. She exhibits tenderness.  Interstitial sounds  Abdominal: Soft. Bowel sounds are normal. She exhibits no distension. There is no tenderness. There is no rebound and no guarding.  Musculoskeletal: Normal range of motion. She exhibits edema (Trace bilateral with mild spider veins. ).  Neurological: She is alert and oriented to person, place, and time. No cranial nerve deficit.  Psychiatric: Her behavior is normal. Judgment and thought content normal.  Very stressed, anxious.  Judgment is off and that she is more concerned about caring for other people and herself.  She says she simply does have  time to care for herself which is ridiculous.  Nursing note and vitals reviewed.    Adult ECG Report Sinus rhythm, rate 66 beats minute.  Nonspecific ST and T wave changes.  Otherwise normal axis, intervals and durations.  Other studies Reviewed: Additional studies/ records that were reviewed today include:  Recent Labs:    August 2018: Total cholesterol 177, tragus rides 201, LDL 85, HDL 51.  A1c 6.0.  BUN/Cr 13/0.5.  ASSESSMENT / PLAN: Problem List Items Addressed This Visit    Pulmonary nodules    This can be reassessed with a coronary CTA which we are trying to get.  She just needs to get it scheduled.  Draw attention to this for the noncardiac review.      Relevant Orders   EKG 12-Lead (Completed)   CT CORONARY MORPH W/CTA COR W/SCORE W/CA W/CM &/OR WO/CM   CT CORONARY FRACTIONAL FLOW RESERVE DATA PREP   CT CORONARY FRACTIONAL FLOW RESERVE FLUID ANALYSIS   Morbid obesity with BMI of 40.0-44.9, adult (HCC) (Chronic)    She has lots of excuses for why she cannot exercise.  As long as she does not make time for herself, this will never happen.  She needs to adjust her diet and get exercising.  Probably the best option for her will be water exercises which would require her taking time for herself and allowing her husband to care for himself.      Exertional dyspnea    Grade 2 diastolic dysfunction noted on echo but I have explained the stent to dyspnea she has.  Unfortunately, she had a PE protocol CT ordered of the posterior coronary CTA.  That did not show any significant pulmonary hypertension but did show some nodules.  This can be followed back up with a recurrent echo. Plan is to check coronary CT angiogram to exclude CAD.      Relevant Orders   EKG 12-Lead (Completed)   Basic metabolic panel   CT CORONARY MORPH W/CTA COR W/SCORE W/CA W/CM &/OR WO/CM   CT CORONARY FRACTIONAL FLOW RESERVE DATA PREP   CT CORONARY FRACTIONAL FLOW RESERVE FLUID ANALYSIS   Essential  hypertension (Chronic)    Blood pressure very high today.  She is so stressed and anxious not planning on doing about  it.  She is on losartan and Inderal.  We can reassess in follow-up.      Edema of both legs (Chronic)    Chronic lower extremity edema.  She is on a standing dose of Lasix which seems to have been under control.  Talked about wearing support stockings and foot elevation.  Probably not related to left-sided heart failure I do not think his cardiac spine more venous stasis related.      Dyslipidemia (Chronic)    Depending on coronary CT angiogram results showed, may need to be more aggressive.  Her LDL however was pretty well controlled back in August 2018 .      Chest pain with moderate risk for cardiac etiology - Primary (Chronic)    Multiple different complaints of chest discomfort.  She is on the beta-blocker she is on aspirin, ARB.  She has had a relatively normal echo.  Probably not a great candidate for Myoview stress test, therefore I am trying to check for CAD with coronary CT angiogram.  We will try to reorder it and get it scheduled.      Relevant Orders   EKG 12-Lead (Completed)   Basic metabolic panel   CT CORONARY MORPH W/CTA COR W/SCORE W/CA W/CM &/OR WO/CM   CT CORONARY FRACTIONAL FLOW RESERVE DATA PREP   CT CORONARY FRACTIONAL FLOW RESERVE FLUID ANALYSIS      Very long visit.  Elianys has multiple complaints and questions concerns etc.  She has lots of social issues.  All told I spent probably 45 to 55 minutes with her.  > 50% of the time was spent in direct patient counseling.  Current medicines are reviewed at length with the patient today. (+/- concerns) n/a The following changes have been made: see instructions  Patient Instructions  NO MEDICATION CHANGES  SCHEDULE AT [facility]  Your physician has requested that you have cardiac CT. Cardiac computed tomography (CT) is a painless test that uses an x-ray machine to take clear, detailed  pictures of your heart. For further information please visit [email or web address] Please follow instruction sheet as given.  Your physician recommends that you schedule a follow-up appointment in 2 MONTHS WITH DR HARDING.  Studies Ordered:   Orders Placed This Encounter  Procedures  . CT CORONARY MORPH W/CTA COR W/SCORE W/CA W/CM &/OR WO/CM  . CT CORONARY FRACTIONAL FLOW RESERVE DATA PREP  . CT CORONARY FRACTIONAL FLOW RESERVE FLUID ANALYSIS  . Basic metabolic panel  . EKG 12-Lead      Bryan Lemma, M.D., M.S. Interventional Cardiologist   Pager # [phone] Phone # [phone] [address] [place], [place] [address]

## 2017-11-13 NOTE — Patient Instructions (Addendum)
NO MEDICATION CHANGES  SCHEDULE AT [facility]  Your physician has requested that you have cardiac CT. Cardiac computed tomography (CT) is a painless test that uses an x-ray machine to take clear, detailed pictures of your heart. For further information please visit [email or web address]. Please follow instruction sheet as given.    Your physician recommends that you schedule a follow-up appointment in 2 MONTHS WITH DR HARDING.      INSTRUCTIONS FOR  CORONARY CTA    Please arrive at the [facility] main entrance of [facility] at (30-45 minutes prior to test start time)  [facility] [address] [place], [place] [id number] [phone]  Proceed to the [facility] Radiology Department (First Floor).  Please follow these instructions carefully (unless otherwise directed):  PLEASE HAVE LABS - BMP  AT LEAST ONE WEEK PRIOR TO TEST  On the Night Before the Test: . Drink plenty of water. . Do not consume any caffeinated/decaffeinated beverages or chocolate 12 hours prior to your test. . Do not take any antihistamines 12 hours prior to your test.    On the Day of the Test: . Drink plenty of water. Do not drink any water within one hour of the test. . Do not eat any food 4 hours prior to the test. . You may take your regular medications prior to the test. .  Take 50 mg of lopressor (metoprolol) one hour before the test.   After the Test: . Drink plenty of water. . After receiving IV contrast, you may experience a mild flushed feeling. This is normal. . On occasion, you may experience a mild rash up to 24 hours after the test. This is not dangerous. If this occurs, you can take Benadryl 25 mg and increase your fluid intake. . If you experience trouble breathing, this can be serious. If it is severe call 911 IMMEDIATELY. If it is mild, please call our office.

## 2017-11-16 ENCOUNTER — Encounter: Payer: Self-pay | Admitting: Cardiology

## 2017-11-16 NOTE — Assessment & Plan Note (Signed)
She has lots of excuses for why she cannot exercise.  As long as she does not make time for herself, this will never happen.  She needs to adjust her diet and get exercising.  Probably the best option for her will be water exercises which would require her taking time for herself and allowing her husband to care for himself.

## 2017-11-16 NOTE — Assessment & Plan Note (Signed)
Chronic lower extremity edema.  She is on a standing dose of Lasix which seems to have been under control.  Talked about wearing support stockings and foot elevation.  Probably not related to left-sided heart failure I do not think his cardiac spine more venous stasis related.

## 2017-11-16 NOTE — Assessment & Plan Note (Signed)
Multiple different complaints of chest discomfort.  She is on the beta-blocker she is on aspirin, ARB.  She has had a relatively normal echo.  Probably not a great candidate for Myoview stress test, therefore I am trying to check for CAD with coronary CT angiogram.  We will try to reorder it and get it scheduled.

## 2017-11-16 NOTE — Assessment & Plan Note (Signed)
Grade 2 diastolic dysfunction noted on echo but I have explained the stent to dyspnea she has.  Unfortunately, she had a PE protocol CT ordered of the posterior coronary CTA.  That did not show any significant pulmonary hypertension but did show some nodules.  This can be followed back up with a recurrent echo. Plan is to check coronary CT angiogram to exclude CAD.

## 2017-11-16 NOTE — Assessment & Plan Note (Signed)
This can be reassessed with a coronary CTA which we are trying to get.  She just needs to get it scheduled.  Draw attention to this for the noncardiac review.

## 2017-11-16 NOTE — Assessment & Plan Note (Signed)
Depending on coronary CT angiogram results showed, may need to be more aggressive.  Her LDL however was pretty well controlled back in August 2018 .

## 2017-11-16 NOTE — Assessment & Plan Note (Signed)
Blood pressure very high today.  She is so stressed and anxious not planning on doing about it.  She is on losartan and Inderal.  We can reassess in follow-up.

## 2017-11-28 ENCOUNTER — Other Ambulatory Visit: Payer: Self-pay | Admitting: Cardiology

## 2017-11-29 NOTE — Telephone Encounter (Signed)
Rx request sent to pharmacy.  

## 2017-12-27 ENCOUNTER — Other Ambulatory Visit: Payer: Self-pay | Admitting: Cardiology

## 2018-01-03 ENCOUNTER — Telehealth: Payer: Self-pay

## 2018-01-03 NOTE — Telephone Encounter (Signed)
Pt came into the office today with some question concerning her scheduled coronary cta. Pt states she is unable to lay flat for a long period of time due to back pain. Pt advised that it is a lengthy test but to be sure to inform the radiologist. Pt also wanted to make sure that the test would be able to show images of her lungs to check on her nodals. Pt also concerned about her Blood sugar increasing during test. Pt advised to eat breakfast before 8 am and to not eat any 4 hours prior to test. Pt verbalized understanding.

## 2018-01-04 LAB — BASIC METABOLIC PANEL
BUN/Creatinine Ratio: 24 (ref 12–28)
BUN: 12 mg/dL (ref 8–27)
CALCIUM: 9.5 mg/dL (ref 8.7–10.3)
CO2: 26 mmol/L (ref 20–29)
Chloride: 101 mmol/L (ref 96–106)
Creatinine, Ser: 0.51 mg/dL — ABNORMAL LOW (ref 0.57–1.00)
GFR calc Af Amer: 110 mL/min/{1.73_m2} (ref >59)
GFR calc non Af Amer: 95 mL/min/{1.73_m2} (ref >59)
Glucose: 123 mg/dL — ABNORMAL HIGH (ref 65–99)
POTASSIUM: 4.5 mmol/L (ref 3.5–5.2)
Sodium: 139 mmol/L (ref 134–144)

## 2018-01-09 ENCOUNTER — Ambulatory Visit (HOSPITAL_COMMUNITY): Payer: Medicare Other

## 2018-01-09 ENCOUNTER — Ambulatory Visit (HOSPITAL_COMMUNITY)
Admission: RE | Admit: 2018-01-09 | Discharge: 2018-01-09 | Disposition: A | Discharge status: Home / Self Care | Payer: Medicare Other | Source: Ambulatory Visit | Attending: Cardiology | Admitting: Cardiology

## 2018-01-09 DIAGNOSIS — R918 Other nonspecific abnormal finding of lung field: Secondary | ICD-10-CM | POA: Diagnosis not present

## 2018-01-09 DIAGNOSIS — R079 Chest pain, unspecified: Secondary | ICD-10-CM

## 2018-01-09 DIAGNOSIS — R0609 Other forms of dyspnea: Secondary | ICD-10-CM | POA: Insufficient documentation

## 2018-01-09 DIAGNOSIS — I700 Atherosclerosis of aorta: Secondary | ICD-10-CM | POA: Diagnosis not present

## 2018-01-09 MED ORDER — IOPAMIDOL (ISOVUE-370) INJECTION 76%
INTRAVENOUS | Status: AC
  Filled 2018-01-09: qty 100

## 2018-01-09 MED ORDER — METOPROLOL TARTRATE 5 MG/5ML IV SOLN
5.0000 mg | INTRAVENOUS | Status: DC | PRN
  Filled 2018-01-09: qty 5

## 2018-01-09 MED ORDER — METOPROLOL TARTRATE 5 MG/5ML IV SOLN
INTRAVENOUS | Status: AC
  Filled 2018-01-09: qty 10

## 2018-01-09 MED ORDER — NITROGLYCERIN 0.4 MG SL SUBL
SUBLINGUAL_TABLET | SUBLINGUAL | Status: AC
  Filled 2018-01-09: qty 2

## 2018-01-09 MED ORDER — IOPAMIDOL (ISOVUE-370) INJECTION 76%
100.0000 mL | Freq: Once | INTRAVENOUS | Status: AC | PRN
  Administered 2018-01-09: 100 mL via INTRAVENOUS

## 2018-01-09 MED ORDER — NITROGLYCERIN 0.4 MG SL SUBL
0.8000 mg | SUBLINGUAL_TABLET | Freq: Once | SUBLINGUAL | Status: AC
  Administered 2018-01-09: 0.8 mg via SUBLINGUAL
  Filled 2018-01-09: qty 25

## 2018-01-10 ENCOUNTER — Encounter: Payer: Self-pay | Admitting: *Deleted

## 2018-01-10 NOTE — Telephone Encounter (Addendum)
Left message for pt to call   ----- Message from Marykay Lexavid W Harding, MD sent at 01/10/2018 12:25 PM EDT ----- Randie HeinzGreat news.  Coronary artery calcium score is 29.  She is at intermediate risk for cardiac events based on this however, coronary CT angiogram was relatively normal:. -- there is no evidence of any significant coronary artery disease noted.  No significant plaque, no significant blockages.  This would argue against her chest pain being related to cardiac disease.  Bryan Lemmaavid Harding, MD

## 2018-01-11 ENCOUNTER — Ambulatory Visit: Payer: Medicare Other | Admitting: Cardiology

## 2018-01-11 ENCOUNTER — Encounter: Payer: Self-pay | Admitting: Cardiology

## 2018-01-11 DIAGNOSIS — I10 Essential (primary) hypertension: Secondary | ICD-10-CM | POA: Diagnosis not present

## 2018-01-11 DIAGNOSIS — R079 Chest pain, unspecified: Secondary | ICD-10-CM | POA: Diagnosis not present

## 2018-01-11 DIAGNOSIS — R600 Localized edema: Secondary | ICD-10-CM

## 2018-01-11 DIAGNOSIS — R0609 Other forms of dyspnea: Secondary | ICD-10-CM

## 2018-01-11 DIAGNOSIS — R918 Other nonspecific abnormal finding of lung field: Secondary | ICD-10-CM

## 2018-01-11 DIAGNOSIS — Z6841 Body Mass Index (BMI) 40.0 and over, adult: Secondary | ICD-10-CM

## 2018-01-11 MED ORDER — LOSARTAN POTASSIUM 50 MG PO TABS: 50.0000 mg | ORAL_TABLET | Freq: Every day | ORAL | 3 refills | Status: DC

## 2018-01-11 NOTE — Telephone Encounter (Signed)
This encounter was created in error - please disregard.

## 2018-01-11 NOTE — Patient Instructions (Signed)
Medication Instructions:   INCREASE LOSARTAN TO 50 MG ONCE DAILY= 2 OF THE 25 MG TABLETS ONCE DAILY  Follow-Up:  Your physician wants you to follow-up in: ONE YEAR WITH DR Herbie BaltimoreHARDING You will receive a reminder letter in the mail two months in advance. If you don't receive a letter, please call our office to schedule the follow-up appointment.   If you need a refill on your cardiac medications before your next appointment, please call your pharmacy.

## 2018-01-11 NOTE — Progress Notes (Signed)
PCP: Georgann HousekeeperHusain, Karrar, MD  Clinic Note: Chief Complaint  Patient presents with  . Follow-up    Results of Cor CTA    HPI: Rebecca Carey is a 77 y.o. female with a PMH below who presents today for 2 month f/u to discuss results of her Coronary CTA ordered to evaluate recurrent episodes of chest pain and shortness of breath.  She has been followed for hypertension and obesity and has had a history of nonischemic cardiomyopathy that is resolved. She had negative Myoview in the past. .  Rebecca Carey was see on June 3 still noting persistent episodes of CP that can last almost all day. Under tremendous amount of stress @ home with her husband.  After a long discussion, she agreed to have a Coronary CTA to exclude ischemic CAD.  We also checked an Echocardiogram.   Recent Hospitalizations: None  Studies Personally Reviewed - (if available, images/films reviewed: From Epic Chart or Care Everywhere)  Cardiac CT Angiography: Coronary calcium score 29 (intermediate risk).  CTA shows no evidence of obstructive CAD.  Only mild calcified plaque in the LEFT MAIN and RCA.   Interval History:  Rebecca Carey presents today  actually doing quite well.  She still has quite a bit of stress, but has not noticed as much of the chest discomfort.  She seems to be better spirits.  She still has intermittent episodes of chest pain, not complaining about the knee was much she did last visit.  Still has low energy level, but not really noticing significant dyspnea.  She does not lie down flat, so difficult to assess PND or orthopnea, but denies any significant edema.  She feels some palpitation but no rapid irregular heartbeats to suggest arrhythmia.  No syncope/near syncope or TIA/Amaurosis Fugax symptoms .  She still has lots of musculoskeletal neurologic symptoms.  Lots of myalgias and arthralgias.  Poor balance makes it hard to walk.  Very difficult time losing weight because of back and hip pain and poor  balance.  ROS: A comprehensive was performed. Review of Systems  Constitutional: Positive for malaise/fatigue (Exercise intolerance).  HENT: Negative for congestion and nosebleeds.   Respiratory: Positive for shortness of breath (With exertion) and wheezing. Negative for cough.        Symptoms beyond dyspnea occur with environmental allergies  Cardiovascular: Positive for leg swelling (Off-and-on).  Gastrointestinal: Negative for blood in stool and melena.  Genitourinary: Negative for dysuria and hematuria.  Musculoskeletal: Positive for back pain, joint pain and myalgias.  Neurological: Positive for dizziness, weakness and headaches.  Endo/Heme/Allergies: Positive for environmental allergies.  Psychiatric/Behavioral: Positive for depression. The patient is nervous/anxious and has insomnia.        Very stressed out.  Dealing with issues with her husband and not getting time for herself.  All other systems reviewed and are negative.  I have reviewed and (if needed) personally updated the patient's problem list, medications, allergies, past medical and surgical history, social and family history.   Past Medical History:  Diagnosis Date  . Diabetes mellitus type 2 in obese (HCC)    Using "herbal therapy"  . Dyslipidemia   . Fatty liver   . HTN (hypertension)   . Hypothyroidism   . Morbid obesity with BMI of 45.0-49.9, adult (HCC)    BMI ~45; 5'2" 242 lb    Past Surgical History:  Procedure Laterality Date  . CORONARY CALCIUM SCORE AND CARDIAC CT ANGIOGRAM     Coronary calcium score equals 29 (  intermediate risk).  CT angiogram to CAD.  Only mild calcified plaque in the LEFT MAIN, and RCA  . NM MYOVIEW LTD  11/06/2012   EF 71%.  No ischemia or infarction.  Anterior defect consistent with breast attenuation  . TRANSTHORACIC ECHOCARDIOGRAM  09/20/2012   normal LV size and function with normal EF  . TRANSTHORACIC ECHOCARDIOGRAM  12/2016   Normal LV size and function with EF 55-60%.  GR 2 DD. No visible valvular lesions.    Chest CTA 12/12/2016: Diffuse thoracic aortic calcification without dilation or dissection. Coronary calcification noted. No PE. Scattered small mediastinal lymph nodes noted.  Current Meds  Medication Sig  . ALPHA LIPOIC ACID PO Take 1 tablet by mouth daily.   Marland Kitchen ALPRAZolam (XANAX) 0.25 MG tablet Take 0.125 mg by mouth at bedtime as needed for anxiety.  . Ascorbic Acid (VITAMIN C PO) Take 1 tablet by mouth daily.  Marland Kitchen aspirin 81 MG tablet Take 81 mg by mouth daily.  . B-12, Methylcobalamin, 1000 MCG SUBL Place 1 tablet under the tongue daily.  Marland Kitchen BIOTIN PO Take 1 tablet by mouth daily.  . CHROMIUM PO Take 1 tablet by mouth 2 (two) times daily.   . Coenzyme Q10 (COQ10) 100 MG CAPS Take 100 mg by mouth daily.  . furosemide (LASIX) 20 MG tablet TAKE 1 TABLET BY MOUTH EVERY DAY  . losartan (COZAAR) 50 MG tablet Take 1 tablet (50 mg total) by mouth daily.  . Multiple Vitamins-Minerals (MULTIVITAMIN PO) Take 1 tablet by mouth daily.  . Omega-3 Fatty Acids (FISH OIL PO) Take 1 capsule by mouth daily.  . ONE TOUCH ULTRA TEST test strip   . OVER THE COUNTER MEDICATION Take 1 capsule by mouth daily. Med Name: GYMNEMA SYLVERTRE LEAF  . OVER THE COUNTER MEDICATION Med Name: CalMax Mix one (1) packet into water to take by mouth daily at bedtime.  . propranolol (INDERAL) 20 MG tablet Take 1 tablet (20 mg total) by mouth 2 (two) times daily.  Marland Kitchen SYNTHROID 125 MCG tablet Take 125 mcg by mouth daily before breakfast.  . [DISCONTINUED] losartan (COZAAR) 25 MG tablet Take 1 tablet (25 mg total) by mouth daily.    Allergies  Allergen Reactions  . Codeine Nausea And Vomiting    unspecified  . Cortisone Other (See Comments)    Turns face and skin red, and warm. Makes heart beat out of chest   . Sulfa Antibiotics Other (See Comments)    Unspecified.    Social History   Tobacco Use  . Smoking status: Never Smoker  . Smokeless tobacco: Never Used  Substance Use  Topics  . Alcohol use: No  . Drug use: No   Social History   Social History Narrative   She is a married mother of 1. She tries to walk every day, does not smoke and does not drink.      She has a significant family history coronary disease.     family history includes Arthritis in her mother and sister; Diabetes in her sister and sister; Heart attack in her father; Heart disease in her sister; Hypothyroidism in her mother and paternal grandfather; Mitral valve prolapse in her mother.  Wt Readings from Last 3 Encounters:  01/11/18 239 lb 6.4 oz (108.6 kg)  11/13/17 237 lb 3.2 oz (107.6 kg)  02/21/17 240 lb 3.2 oz (109 kg)    PHYSICAL EXAM BP (!) 152/74   Pulse 68   Ht 5\' 2"  (1.575 m)   Wt  239 lb 6.4 oz (108.6 kg)   BMI 43.79 kg/m  Physical Exam  Constitutional: She is oriented to person, place, and time. She appears well-developed and well-nourished. No distress.  Obese. Deconditioned. Well-groomed  HENT:  Head: Normocephalic and atraumatic.  Neck: Normal range of motion. Neck supple. No JVD present. No tracheal deviation present.  Cardiovascular: Normal rate, regular rhythm and normal heart sounds. Exam reveals no gallop and no friction rub.  Pulmonary/Chest: Effort normal and breath sounds normal. No respiratory distress. She has no wheezes. She has no rales. She exhibits tenderness.  Interstitial sounds  Abdominal: Soft. Bowel sounds are normal. She exhibits no distension. There is no tenderness. There is no rebound and no guarding.  Musculoskeletal: Normal range of motion. She exhibits edema (Trace bilateral with mild spider veins. ).  Neurological: She is alert and oriented to person, place, and time. No cranial nerve deficit.  Psychiatric: She has a normal mood and affect. Her behavior is normal. Judgment and thought content normal.  Much more relaxed and calm mood today.  Nursing note and vitals reviewed.    Adult ECG Report Not checked  Other studies  Reviewed: Additional studies/ records that were reviewed today include:  Recent Labs: No labs available since August 2018: Total cholesterol 177, tg 201, LDL 85, HDL 51.  A1c 6.0.  BUN/Cr 13/0.5.  ASSESSMENT / PLAN: Problem List Items Addressed This Visit    Chest pain with low risk for cardiac etiology (Chronic)    Multiple different chest pain episodes detected chest pain.  Probably all musculoskeletal in nature.  Very reassuring results for coronary CT angiogram pretty much excludes ischemic CAD as etiology.  Simply continue to treat risk factors.      Edema of both legs (Chronic)    Relatively controlled on current dose of Lasix.      Essential hypertension (Chronic)    Still with the.  With grade 2 diastolic dysfunction needs better afterload reduction. Plan: Increase Losartan to  50 mg daily.      Relevant Medications   losartan (COZAAR) 50 MG tablet   Exertional dyspnea    With essentially normal pulmonary CT angiogram, this excludes CAD.  She does have echocardiographic evidence of grade 2 diastolic dysfunction -  DOE is likely multifactorial mostly related to deconditioning, obesity with a component of diastolic dysfunction. Decreased blood pressure control by increasing ARB.  Continue to exercise and lose weight.      Morbid obesity with BMI of 40.0-44.9, adult (HCC) (Chronic)    I talked about trying to get in the water aerobics since she has a hard time to exercise on land.  Need to find for the will allow her to walk-in is supposed to having to climb into.       Pulmonary nodules    No comment on pulmonary nodules in this most recent CT scan.  May need to re-evaluate noncontrast CT scan in roughly 6 months.  Defer to PCP.          Current medicines are reviewed at length with the patient today. (+/- concerns) n/a The following changes have been made: see instructions Patient Instructions  Medication Instructions:   INCREASE LOSARTAN TO 50 MG ONCE DAILY=  2 OF THE 25 MG TABLETS ONCE DAILY  Follow-Up:  Your physician wants you to follow-up in: ONE YEAR WITH DR Herbie Baltimore You will receive a reminder letter in the mail two months in advance. If you don't receive a letter, please call our office  to schedule the follow-up appointment.   If you need a refill on your cardiac medications before your next appointment, please call your pharmacy.    Studies Ordered:   No orders of the defined types were placed in this encounter.     Bryan Lemma, M.D., M.S. Interventional Cardiologist   Pager # [phone] Phone # [phone] [address] [place], [place] [address]

## 2018-01-13 ENCOUNTER — Encounter: Payer: Self-pay | Admitting: Cardiology

## 2018-01-13 NOTE — Assessment & Plan Note (Signed)
With essentially normal pulmonary CT angiogram, this excludes CAD.  She does have echocardiographic evidence of grade 2 diastolic dysfunction -  DOE is likely multifactorial mostly related to deconditioning, obesity with a component of diastolic dysfunction. Decreased blood pressure control by increasing ARB.  Continue to exercise and lose weight.

## 2018-01-13 NOTE — Assessment & Plan Note (Signed)
No comment on pulmonary nodules in this most recent CT scan.  May need to re-evaluate noncontrast CT scan in roughly 6 months.  Defer to PCP.

## 2018-01-13 NOTE — Assessment & Plan Note (Signed)
Multiple different chest pain episodes detected chest pain.  Probably all musculoskeletal in nature.  Very reassuring results for coronary CT angiogram pretty much excludes ischemic CAD as etiology.  Simply continue to treat risk factors.

## 2018-01-13 NOTE — Assessment & Plan Note (Signed)
Still with the.  With grade 2 diastolic dysfunction needs better afterload reduction. Plan: Increase Losartan to  50 mg daily.

## 2018-01-13 NOTE — Assessment & Plan Note (Signed)
I talked about trying to get in the water aerobics since she has a hard time to exercise on land.  Need to find for the will allow her to walk-in is supposed to having to climb into.

## 2018-01-13 NOTE — Assessment & Plan Note (Signed)
Relatively controlled on current dose of Lasix.

## 2018-02-16 ENCOUNTER — Other Ambulatory Visit: Payer: Self-pay | Admitting: Internal Medicine

## 2018-02-16 ENCOUNTER — Ambulatory Visit
Admission: RE | Admit: 2018-02-16 | Discharge: 2018-02-16 | Disposition: A | Discharge status: Home / Self Care | Payer: Medicare Other | Source: Ambulatory Visit | Attending: Internal Medicine | Admitting: Internal Medicine

## 2018-02-16 DIAGNOSIS — R05 Cough: Secondary | ICD-10-CM

## 2018-02-16 DIAGNOSIS — R0602 Shortness of breath: Secondary | ICD-10-CM

## 2018-02-16 DIAGNOSIS — R059 Cough, unspecified: Secondary | ICD-10-CM

## 2018-03-13 ENCOUNTER — Other Ambulatory Visit: Payer: Self-pay | Admitting: Internal Medicine

## 2018-03-13 ENCOUNTER — Ambulatory Visit
Admission: RE | Admit: 2018-03-13 | Discharge: 2018-03-13 | Disposition: A | Discharge status: Home / Self Care | Payer: Medicare Other | Source: Ambulatory Visit | Attending: Internal Medicine | Admitting: Internal Medicine

## 2018-03-13 DIAGNOSIS — R059 Cough, unspecified: Secondary | ICD-10-CM

## 2018-03-13 DIAGNOSIS — R05 Cough: Secondary | ICD-10-CM

## 2018-04-13 ENCOUNTER — Other Ambulatory Visit: Payer: Self-pay | Admitting: Internal Medicine

## 2018-04-13 DIAGNOSIS — Z1231 Encounter for screening mammogram for malignant neoplasm of breast: Secondary | ICD-10-CM

## 2018-05-24 ENCOUNTER — Ambulatory Visit
Admission: RE | Admit: 2018-05-24 | Discharge: 2018-05-24 | Disposition: A | Discharge status: Home / Self Care | Payer: Medicare Other | Source: Ambulatory Visit | Attending: Internal Medicine | Admitting: Internal Medicine

## 2018-05-24 DIAGNOSIS — Z1231 Encounter for screening mammogram for malignant neoplasm of breast: Secondary | ICD-10-CM

## 2018-06-19 ENCOUNTER — Other Ambulatory Visit: Payer: Self-pay | Admitting: Cardiology

## 2018-06-24 ENCOUNTER — Other Ambulatory Visit: Payer: Self-pay | Admitting: Cardiology

## 2018-08-16 ENCOUNTER — Other Ambulatory Visit: Payer: Self-pay | Admitting: Cardiology

## 2018-08-21 ENCOUNTER — Other Ambulatory Visit: Payer: Self-pay

## 2018-08-21 ENCOUNTER — Other Ambulatory Visit: Payer: Self-pay | Admitting: *Deleted

## 2018-08-21 MED ORDER — LOSARTAN POTASSIUM 50 MG PO TABS: 50.0000 mg | ORAL_TABLET | Freq: Every day | ORAL | 1 refills | Status: DC

## 2018-08-21 NOTE — Telephone Encounter (Signed)
Rx(s) sent to pharmacy electronically.  

## 2018-09-17 NOTE — Telephone Encounter (Signed)
May switch to 25 mg tablets x 2 daily  to equal 50 mg  total, 50 mg is on back order.

## 2019-01-18 ENCOUNTER — Other Ambulatory Visit: Payer: Self-pay

## 2019-01-18 ENCOUNTER — Other Ambulatory Visit (HOSPITAL_COMMUNITY): Payer: Self-pay | Admitting: Internal Medicine

## 2019-01-18 ENCOUNTER — Ambulatory Visit (HOSPITAL_COMMUNITY)
Admission: RE | Admit: 2019-01-18 | Discharge: 2019-01-18 | Disposition: A | Discharge status: Home / Self Care | Payer: Medicare Other | Source: Ambulatory Visit | Attending: Internal Medicine | Admitting: Internal Medicine

## 2019-01-18 DIAGNOSIS — R7989 Other specified abnormal findings of blood chemistry: Secondary | ICD-10-CM | POA: Diagnosis not present

## 2019-01-18 NOTE — Progress Notes (Signed)
RLE venous duplex       has been completed. Preliminary results can be found under CV proc through chart review. June Leap, BS, RDMS, RVT     Called report to office of Dr. Fara Olden. 3:55pm  On hold for nurse. No answer x 2 4:00pm Spoke to [place] at 4:02pm and gave results.

## 2019-02-19 ENCOUNTER — Other Ambulatory Visit: Payer: Self-pay | Admitting: Cardiology

## 2019-02-19 MED ORDER — LOSARTAN POTASSIUM 50 MG PO TABS: 50.0000 mg | ORAL_TABLET | Freq: Every day | ORAL | 0 refills | Status: DC

## 2019-02-19 NOTE — Telephone Encounter (Signed)
Pt's medication was sent to pt's pharmacy as requested. Confirmation received.  °

## 2019-03-27 NOTE — Progress Notes (Signed)
Office Visit Note  Patient: Rebecca Carey             Date of Birth: [birth year]           MRN: [id number]             PCP: Wenda Low, MD Referring: Wenda Low, MD Visit Date: 04/10/2019 Occupation: @GUAROCC @  Subjective:  Pain in right knee.   History of Present Illness: Rebecca Carey is a 78 y.o. female seen in consultation per request of Dr. Sherrye Payor.  [name] is a previous patient of mine who was seen last in the office in October 2014.  She was diagnosed with Sjogren's on the basis of positive SSA, positive SSB and positive ANA.  She also had sicca symptoms.  She was offered pilocarpine and Plaquenil which she declined at the time.  She has been using over-the-counter products.  She was also diagnosed with osteoarthritis of hip joints, knee joints and chondromalacia patella.  She has had Visco supplement injections x2 in the past and the last series was in 2014.  She has known history of disc disease of lumbar spine she is not having much discomfort in her back.  She states recently she has been experiencing increased fullness and pain in her right knee joint.  She states I have aspirated her knee joint in the past.  She is also interested in getting Visco supplement injections again.  She has been taking some supplements which has been helpful.  Activities of Daily Living:  Patient reports morning stiffness for 30 minutes.   Patient Reports nocturnal pain.  Difficulty dressing/grooming: Denies Difficulty climbing stairs: Reports Difficulty getting out of chair: Denies Difficulty using hands for taps, buttons, cutlery, and/or writing: Denies  Review of Systems  Constitutional: Positive for fatigue. Negative for night sweats, weight gain and weight loss.  HENT: Positive for mouth dryness. Negative for mouth sores, trouble swallowing, trouble swallowing and nose dryness.   Eyes: Positive for dryness. Negative for pain, redness and visual disturbance.   Respiratory: Negative for cough, shortness of breath and difficulty breathing.   Cardiovascular: Negative for chest pain, palpitations, hypertension, irregular heartbeat and swelling in legs/feet.  Gastrointestinal: Positive for blood in stool. Negative for constipation and diarrhea.       History of internal hemorrhoids  Endocrine: Negative for increased urination.  Genitourinary: Negative for painful urination and vaginal dryness.  Musculoskeletal: Positive for arthralgias, joint pain, joint swelling and morning stiffness. Negative for myalgias, muscle weakness, muscle tenderness and myalgias.  Skin: Negative for color change, rash, hair loss, skin tightness, ulcers and sensitivity to sunlight.  Allergic/Immunologic: Negative for susceptible to infections.  Neurological: Negative for dizziness, numbness, headaches, memory loss, night sweats and weakness.  Hematological: Negative for bruising/bleeding tendency and swollen glands.  Psychiatric/Behavioral: Positive for sleep disturbance. Negative for depressed mood and confusion. The patient is not nervous/anxious.     PMFS History:  Patient Active Problem List   Diagnosis Date Noted  . Pulmonary nodules 02/21/2017  . Exertional dyspnea 12/06/2016  . Chest pain with low risk for cardiac etiology 12/11/2012  . Edema of both legs 12/09/2012  . Morbid obesity with BMI of 40.0-44.9, adult ([place])   . Essential hypertension   . Dyslipidemia   . Diabetes mellitus type 2 in obese ([facility])   . Hypothyroidism     Past Medical History:  Diagnosis Date  . Diabetes mellitus type 2 in obese ([facility])    Using "herbal therapy"  . Dyslipidemia   .  Fatty liver   . HTN (hypertension)   . Hypothyroidism   . Morbid obesity with BMI of 45.0-49.9, adult (HCC)    BMI ~45; 5'2" 242 lb    Family History  Problem Relation Age of Onset  . Arthritis Mother   . Mitral valve prolapse Mother   . Hypothyroidism Mother   . Heart attack Father   . Diabetes Sister    . Diabetes Sister   . Arthritis Sister   . Heart disease Sister   . Hypothyroidism Paternal Grandfather   . Hashimoto's thyroiditis Daughter    Past Surgical History:  Procedure Laterality Date  . CORONARY CALCIUM SCORE AND CARDIAC CT ANGIOGRAM     Coronary calcium score equals 29 (intermediate risk).  CT angiogram to CAD.  Only mild calcified plaque in the LEFT MAIN, and RCA  . NM MYOVIEW LTD  11/06/2012   EF 71%.  No ischemia or infarction.  Anterior defect consistent with breast attenuation  . TRANSTHORACIC ECHOCARDIOGRAM  09/20/2012   normal LV size and function with normal EF  . TRANSTHORACIC ECHOCARDIOGRAM  12/2016   Normal LV size and function with EF 55-60%. GR 2 DD. No visible valvular lesions.   Social History   Social History Narrative   She is a married mother of 1. She tries to walk every day, does not smoke and does not drink.      She has a significant family history coronary disease.    There is no immunization history on file for this patient.   Objective: Vital Signs: BP (!) 156/76 (BP Location: Left Arm, Patient Position: Sitting, Cuff Size: Large)   Pulse 65   Resp 16   Ht  (1.575 m)   Wt 244 lb (110.7 kg)   BMI 44.63 kg/m    Physical Exam Vitals signs and nursing note reviewed.  Constitutional:      Appearance: She is well-developed.  HENT:     Head: Normocephalic and atraumatic.  Eyes:     Conjunctiva/sclera: Conjunctivae normal.  Neck:     Musculoskeletal: Normal range of motion.  Cardiovascular:     Rate and Rhythm: Normal rate and regular rhythm.     Heart sounds: Normal heart sounds.  Pulmonary:     Effort: Pulmonary effort is normal.     Breath sounds: Normal breath sounds.  Abdominal:     General: Bowel sounds are normal.     Palpations: Abdomen is soft.  Lymphadenopathy:     Cervical: No cervical adenopathy.  Skin:    General: Skin is warm and dry.     Capillary Refill: Capillary refill takes less than 2 seconds.   Neurological:     Mental Status: She is alert and oriented to person, place, and time.  Psychiatric:        Behavior: Behavior normal.      Musculoskeletal Exam: Patient has limited range of motion of her cervical and lumbar spine.  Shoulder joints elbow joints wrist joints with good range of motion.  She has DIP and PIP thickening in her hands consistent with osteoarthritis.  She has painful range of motion of her hip joints and her knee joints.  She has warmth and swelling in the right knee joint without any effusion.  Ankle joints with good range of motion.  CDAI Exam: CDAI Score: - Patient Global: -; Provider Global: - Swollen: -; Tender: - Joint Exam   No joint exam has been documented for this visit   There is  currently no information documented on the homunculus. Go to the Rheumatology activity and complete the homunculus joint exam.  Investigation: No additional findings.  Imaging: No results found.  Recent Labs: Lab Results  Component Value Date   WBC 5.6 07/13/2014   HGB 13.5 07/13/2014   PLT 295 07/13/2014   NA 139 01/03/2018   K 4.5 01/03/2018   CL 101 01/03/2018   CO2 26 01/03/2018   GLUCOSE 123 (H) 01/03/2018   BUN 12 01/03/2018   CREATININE 0.51 (L) 01/03/2018   BILITOT 0.8 07/13/2014   ALKPHOS 66 07/13/2014   AST 22 07/13/2014   ALT 20 07/13/2014   PROT 6.7 07/13/2014   ALBUMIN 3.6 07/13/2014   CALCIUM 9.5 01/03/2018   GFRAA 110 01/03/2018    Speciality Comments: No specialty comments available.  Procedures:  No procedures performed Allergies: Codeine, Cortisone, and Sulfa antibiotics   Assessment / Plan:     Visit Diagnoses: Sjogren's syndrome with other organ involvement (HCC) - +Ro, +La, +ANA (previous pt in 2014), patient did not want to take Plaquenil.  She had increased intraocular pressure and could not take pilocarpine.-hx of Fuch's dystrophy and increased IOP -she states her sicca symptoms are tolerable with over-the-counter products.   She would like to check some of her labs today to see that titers.  I will obtain following labs today.  Plan: CBC with Differential/Platelet, COMPLETE METABOLIC PANEL WITH GFR, Sedimentation rate, Rheumatoid factor, Sjogrens syndrome-A extractable nuclear antibody, Sjogrens syndrome-B extractable nuclear antibody, Anti-DNA antibody, double-stranded, ANA, C3 and C4, Serum protein electrophoresis with reflex  Chronic pain of both knees -she continues to have pain and discomfort in her bilateral knee joints.  She has had Visco supplement injections in the past x2.  She states the last series was in 2014.  She cannot have cortisone injections because of palpitations.  She would like to apply for repeat Visco supplement injections.  We will apply for Visco supplement injection to bilateral knee joints.  plan: XR KNEE 3 VIEW RIGHT, XR KNEE 3 VIEW LEFT.  X-ray of the knee joints revealed bilateral moderate osteoarthritis and severe chondromalacia patella.  This patient is diagnosed with osteoarthritis of the knee(s).    Radiographs show evidence of joint space narrowing, osteophytes, subchondral sclerosis and/or subchondral cysts.  This patient has knee pain which interferes with functional and activities of daily living.    This patient has experienced inadequate response, adverse effects and/or intolerance with conservative treatments such as acetaminophen, NSAIDS, topical creams, physical therapy or regular exercise, knee bracing and/or weight loss.   Steroid injections are contraindicated due to her cardiac condition.  This patient is not scheduled to have a total knee replacement within 6 months of starting treatment with viscosupplementation.  Primary osteoarthritis of both knees  Primary osteoarthritis of both hips-she continues to have discomfort in her bilateral hip joints.  She has some limitation with range of motion.  Primary osteoarthritis of both hands-clinical findings are consistent with  osteoarthritis.  She has bilateral DIP and PIP thickening.  DDD (degenerative disc disease), lumbar-she has chronic lower back pain.  Osteopenia, unspecified location-patient states her DEXA is done by her PCP and she has osteopenia.  She has been taking calcium and vitamin D.  History of hypothyroidism  Essential hypertension-her blood pressures are still elevated.  Dyslipidemia-weight loss diet and exercise was discussed.  Fuchs' corneal dystrophy  History of diabetes mellitus, type II-controlled by diet.  Hiatal hernia  MVP (mitral valve prolapse)  Diastolic dysfunction  Orders: Orders Placed This Encounter  Procedures  . XR KNEE 3 VIEW RIGHT  . XR KNEE 3 VIEW LEFT  . CBC with Differential/Platelet  . COMPLETE METABOLIC PANEL WITH GFR  . Sedimentation rate  . Rheumatoid factor  . Sjogrens syndrome-A extractable nuclear antibody  . Sjogrens syndrome-B extractable nuclear antibody  . Anti-DNA antibody, double-stranded  . ANA  . C3 and C4  . Serum protein electrophoresis with reflex   No orders of the defined types were placed in this encounter.   Face-to-face time spent with patient was 50 minutes. Greater than 50% of time was spent in counseling and coordination of care.  Follow-Up Instructions: Return for Osteoarthritis, Sjogren's.   Pollyann SavoyShaili Emmy Keng, MD  Note - This record has been created using Animal nutritionistDragon software.  Chart creation errors have been sought, but may not always  have been located. Such creation errors do not reflect on  the standard of medical care.

## 2019-04-06 ENCOUNTER — Other Ambulatory Visit: Payer: Self-pay | Admitting: Cardiology

## 2019-04-10 ENCOUNTER — Telehealth: Payer: Self-pay | Admitting: *Deleted

## 2019-04-10 ENCOUNTER — Other Ambulatory Visit: Payer: Self-pay

## 2019-04-10 ENCOUNTER — Ambulatory Visit: Payer: Self-pay

## 2019-04-10 ENCOUNTER — Ambulatory Visit (INDEPENDENT_AMBULATORY_CARE_PROVIDER_SITE_OTHER): Payer: Medicare Other | Admitting: Rheumatology

## 2019-04-10 ENCOUNTER — Encounter: Payer: Self-pay | Admitting: Rheumatology

## 2019-04-10 VITALS — BP 156/76 | HR 65 | Resp 16 | Ht 62.0 in | Wt 244.0 lb

## 2019-04-10 DIAGNOSIS — M25561 Pain in right knee: Secondary | ICD-10-CM | POA: Diagnosis not present

## 2019-04-10 DIAGNOSIS — G8929 Other chronic pain: Secondary | ICD-10-CM

## 2019-04-10 DIAGNOSIS — E785 Hyperlipidemia, unspecified: Secondary | ICD-10-CM

## 2019-04-10 DIAGNOSIS — M5136 Other intervertebral disc degeneration, lumbar region: Secondary | ICD-10-CM

## 2019-04-10 DIAGNOSIS — M19041 Primary osteoarthritis, right hand: Secondary | ICD-10-CM

## 2019-04-10 DIAGNOSIS — M160 Bilateral primary osteoarthritis of hip: Secondary | ICD-10-CM

## 2019-04-10 DIAGNOSIS — I341 Nonrheumatic mitral (valve) prolapse: Secondary | ICD-10-CM

## 2019-04-10 DIAGNOSIS — K449 Diaphragmatic hernia without obstruction or gangrene: Secondary | ICD-10-CM

## 2019-04-10 DIAGNOSIS — M3509 Sicca syndrome with other organ involvement: Secondary | ICD-10-CM | POA: Diagnosis not present

## 2019-04-10 DIAGNOSIS — M25562 Pain in left knee: Secondary | ICD-10-CM | POA: Diagnosis not present

## 2019-04-10 DIAGNOSIS — H18519 Endothelial corneal dystrophy, unspecified eye: Secondary | ICD-10-CM

## 2019-04-10 DIAGNOSIS — I5189 Other ill-defined heart diseases: Secondary | ICD-10-CM

## 2019-04-10 DIAGNOSIS — M19042 Primary osteoarthritis, left hand: Secondary | ICD-10-CM

## 2019-04-10 DIAGNOSIS — M170 Bilateral primary osteoarthritis of knee: Secondary | ICD-10-CM | POA: Diagnosis not present

## 2019-04-10 DIAGNOSIS — M51369 Other intervertebral disc degeneration, lumbar region without mention of lumbar back pain or lower extremity pain: Secondary | ICD-10-CM

## 2019-04-10 DIAGNOSIS — M8580 Other specified disorders of bone density and structure, unspecified site: Secondary | ICD-10-CM

## 2019-04-10 DIAGNOSIS — Z8639 Personal history of other endocrine, nutritional and metabolic disease: Secondary | ICD-10-CM

## 2019-04-10 DIAGNOSIS — I10 Essential (primary) hypertension: Secondary | ICD-10-CM

## 2019-04-10 NOTE — Telephone Encounter (Signed)
Please apply for Bilateral visco for Rebecca Carey. Thank you.

## 2019-04-11 NOTE — Telephone Encounter (Signed)
Submited for VOB.

## 2019-04-12 LAB — COMPLETE METABOLIC PANEL WITH GFR
AG Ratio: 1.5 (calc) (ref 1.0–2.5)
ALT: 16 U/L (ref 6–29)
AST: 17 U/L (ref 10–35)
Albumin: 4.2 g/dL (ref 3.6–5.1)
Alkaline phosphatase (APISO): 80 U/L (ref 37–153)
BUN/Creatinine Ratio: 31 (calc) — ABNORMAL HIGH (ref 6–22)
BUN: 17 mg/dL (ref 7–25)
CO2: 28 mmol/L (ref 20–32)
Calcium: 9.9 mg/dL (ref 8.6–10.4)
Chloride: 103 mmol/L (ref 98–110)
Creat: 0.54 mg/dL — ABNORMAL LOW (ref 0.60–0.93)
GFR, Est African American: 107 mL/min/{1.73_m2} (ref >=60)
GFR, Est Non African American: 92 mL/min/{1.73_m2} (ref >=60)
Globulin: 2.8 g/dL (calc) (ref 1.9–3.7)
Glucose, Bld: 104 mg/dL — ABNORMAL HIGH (ref 65–99)
Potassium: 4.8 mmol/L (ref 3.5–5.3)
Sodium: 140 mmol/L (ref 135–146)
Total Bilirubin: 0.5 mg/dL (ref 0.2–1.2)
Total Protein: 7 g/dL (ref 6.1–8.1)

## 2019-04-12 LAB — CBC WITH DIFFERENTIAL/PLATELET
Absolute Monocytes: 554 cells/uL (ref 200–950)
Basophils Absolute: 39 cells/uL (ref 0–200)
Basophils Relative: 0.5 %
Eosinophils Absolute: 257 cells/uL (ref 15–500)
Eosinophils Relative: 3.3 %
HCT: 41.5 % (ref 35.0–45.0)
Hemoglobin: 13.8 g/dL (ref 11.7–15.5)
Lymphs Abs: 1872 cells/uL (ref 850–3900)
MCH: 29.2 pg (ref 27.0–33.0)
MCHC: 33.3 g/dL (ref 32.0–36.0)
MCV: 87.7 fL (ref 80.0–100.0)
MPV: 10.9 fL (ref 7.5–12.5)
Monocytes Relative: 7.1 %
Neutro Abs: 5078 cells/uL (ref 1500–7800)
Neutrophils Relative %: 65.1 %
Platelets: 319 10*3/uL (ref 140–400)
RBC: 4.73 10*6/uL (ref 3.80–5.10)
RDW: 12.5 % (ref 11.0–15.0)
Total Lymphocyte: 24 %
WBC: 7.8 10*3/uL (ref 3.8–10.8)

## 2019-04-12 LAB — SEDIMENTATION RATE: Sed Rate: 19 mm/h (ref 0–30)

## 2019-04-12 LAB — PROTEIN ELECTROPHORESIS, SERUM, WITH REFLEX
Albumin ELP: 3.9 g/dL (ref 3.8–4.8)
Alpha 1: 0.3 g/dL (ref 0.2–0.3)
Alpha 2: 0.7 g/dL (ref 0.5–0.9)
Beta 2: 0.5 g/dL (ref 0.2–0.5)
Beta Globulin: 0.5 g/dL (ref 0.4–0.6)
Gamma Globulin: 1 g/dL (ref 0.8–1.7)
Total Protein: 7 g/dL (ref 6.1–8.1)

## 2019-04-12 LAB — ANTI-NUCLEAR AB-TITER (ANA TITER)
ANA TITER: 1:80 {titer} — ABNORMAL HIGH
ANA Titer 1: 1:80 {titer} — ABNORMAL HIGH

## 2019-04-12 LAB — SJOGRENS SYNDROME-B EXTRACTABLE NUCLEAR ANTIBODY: SSB (La) (ENA) Antibody, IgG: 5.1 AI — AB

## 2019-04-12 LAB — C3 AND C4
C3 Complement: 125 mg/dL (ref 83–193)
C4 Complement: 29 mg/dL (ref 15–57)

## 2019-04-12 LAB — SJOGRENS SYNDROME-A EXTRACTABLE NUCLEAR ANTIBODY: SSA (Ro) (ENA) Antibody, IgG: >8 AI — AB

## 2019-04-12 LAB — ANTI-DNA ANTIBODY, DOUBLE-STRANDED: ds DNA Ab: <1 IU/mL

## 2019-04-12 LAB — ANA: Anti Nuclear Antibody (ANA): POSITIVE — AB

## 2019-04-12 LAB — RHEUMATOID FACTOR: Rheumatoid fact SerPl-aCnc: <14 IU/mL (ref <14)

## 2019-04-15 ENCOUNTER — Other Ambulatory Visit: Payer: Self-pay

## 2019-04-15 ENCOUNTER — Ambulatory Visit (INDEPENDENT_AMBULATORY_CARE_PROVIDER_SITE_OTHER): Payer: Medicare Other | Admitting: Cardiology

## 2019-04-15 VITALS — BP 167/79 | HR 66 | Ht 62.0 in | Wt 247.0 lb

## 2019-04-15 DIAGNOSIS — R600 Localized edema: Secondary | ICD-10-CM

## 2019-04-15 DIAGNOSIS — E785 Hyperlipidemia, unspecified: Secondary | ICD-10-CM

## 2019-04-15 DIAGNOSIS — R0609 Other forms of dyspnea: Secondary | ICD-10-CM

## 2019-04-15 DIAGNOSIS — Z6841 Body Mass Index (BMI) 40.0 and over, adult: Secondary | ICD-10-CM

## 2019-04-15 DIAGNOSIS — R0600 Dyspnea, unspecified: Secondary | ICD-10-CM

## 2019-04-15 DIAGNOSIS — I10 Essential (primary) hypertension: Secondary | ICD-10-CM | POA: Diagnosis not present

## 2019-04-15 MED ORDER — LOSARTAN POTASSIUM 50 MG PO TABS: 50.0000 mg | ORAL_TABLET | Freq: Two times a day (BID) | ORAL | 3 refills | Status: DC

## 2019-04-15 NOTE — Patient Instructions (Signed)
Medication Instructions:   increase losartan to 50 mg twice a day   *If you need a refill on your cardiac medications before your next appointment, please call your pharmacy*  Lab Work: not needed    Testing/Procedures: Not needed  Follow-Up: At [facility], you and your health needs are our priority.  As part of our continuing mission to provide you with exceptional heart care, we have created designated Provider Care Teams.  These Care Teams include your primary Cardiologist (physician) and Advanced Practice Providers (APPs -  Physician Assistants and Nurse Practitioners) who all work together to provide you with the care you need, when you need it.  Your next appointment:   12 months  The format for your next appointment:   In Person  Provider:   Glenetta Hew, MD  Other Instructions

## 2019-04-15 NOTE — Telephone Encounter (Signed)
LMOM for patient to call and schedule Synvisc injections for her bilateral knees. °

## 2019-04-15 NOTE — Progress Notes (Signed)
PCP: Georgann HousekeeperHusain, Karrar, MD  Clinic Note: Chief Complaint  Patient presents with   Follow-up    Still has some shortness of breath and swelling   Hypertension    HPI:     Rebecca Carey is a 78 y.o. female with a PMH below who presents today for annual follow-up of hypertension.  Rebecca Carey was last seen on in August 2019.  This was a 264-month follow-up after coronary CT angiogram to evaluate chest pain and dyspnea.  Coronary CTA showed a coronary calcium score of 29 with no obstructive disease.  Only mild calcified plaque. ->  She was doing very well at this visit.  Still having greatest episode of chest pain but very happy to hear the results of her test.  Low energy but no significant dyspnea.  Lots of musculoskeletal symptoms.  Recent Hospitalizations: None  Reviewed  CV studies:    The following studies were reviewed today: (if available, images/films reviewed: From Epic Chart or Care Everywhere)  Low extremity venous Dopplers.  No right-sided DVT.:   Interval History:   Rebecca Carey returns today for annual follow-up actually without any major complaints.  She had some episodes of dyspnea most notably back in September and was still hot.  As is gotten little cooler she is doing better.  Really what she knows now is that her legs 8 all the time and her weight is going back up.  She still has a lot of musculoskeletal type chest discomfort that has been ongoing for long time but no new symptoms of chest pain or pressure.  She has some mild lower extremity edema but no PND orthopnea.  Cardiovascular review of symptoms: (Summary) positive for - dyspnea on exertion, edema and Weight gain, fatigue, leg pain negative for - chest pain, irregular heartbeat, orthopnea, palpitations, paroxysmal nocturnal dyspnea, rapid heart rate, shortness of breath or (Chest pain is musculoskeletal, all over).  No syncope/near syncope or TIA/amaurosis fugax.  No claudication.  The patient does  not have symptoms concerning for COVID-19 infection (fever, chills, cough, or new shortness of breath).  The patient is practicing social distancing. ++ Masking.  Barely goes out for groceries/shopping.   REVIEWED OF SYSTEMS   ROS: A comprehensive was performed. Review of Systems  Constitutional: Positive for malaise/fatigue (Still has exercise intolerance.). Negative for weight loss (Weight gain).  HENT: Negative for congestion and nosebleeds.   Respiratory: Negative for cough and wheezing.   Cardiovascular: Positive for leg swelling (Off-and-on swelling.  Not currently).  Gastrointestinal: Negative for blood in stool, constipation, diarrhea, heartburn and melena.  Genitourinary: Negative for hematuria.  Musculoskeletal: Positive for joint pain and myalgias.       Diffuse aches and pains, musculoskeletal  Neurological: Positive for dizziness (Sometimes when she stands up quickly or when she is try to walk, her balance is off.), tingling, focal weakness and headaches. Negative for weakness.  Psychiatric/Behavioral: Positive for depression ([place]-controlled). The patient is nervous/anxious (A little less anxious today than usual.) and has insomnia.     I have reviewed and (if needed) personally updated the patient's problem list, medications, allergies, past medical and surgical history, social and family history.   PAST MEDICAL HISTORY   Past Medical History:  Diagnosis Date   Diabetes mellitus type 2 in obese (HCC)    Using "herbal therapy"   Dyslipidemia    Fatty liver    HTN (hypertension)    Hypothyroidism    Morbid obesity with BMI of 45.0-49.9, adult (HCC)  BMI ~45; 5'2" 242 lb     PAST SURGICAL HISTORY   Past Surgical History:  Procedure Laterality Date   CORONARY CALCIUM SCORE AND CARDIAC CT ANGIOGRAM     Coronary calcium score equals 29 (intermediate risk).  CT angiogram to CAD.  Only mild calcified plaque in the LEFT MAIN, and RCA   NM MYOVIEW LTD   11/06/2012   EF 71%.  No ischemia or infarction.  Anterior defect consistent with breast attenuation   TRANSTHORACIC ECHOCARDIOGRAM  09/20/2012   normal LV size and function with normal EF   TRANSTHORACIC ECHOCARDIOGRAM  12/2016   Normal LV size and function with EF 55-60%. GR 2 DD. No visible valvular lesions.     MEDICATIONS/ALLERGIES   Current Meds  Medication Sig   ALPHA LIPOIC ACID PO Take 1 tablet by mouth daily.    ALPRAZolam (XANAX) 0.25 MG tablet Take 0.125 mg by mouth at bedtime as needed for anxiety.   Ascorbic Acid (VITAMIN C PO) Take 1 tablet by mouth daily.   aspirin 81 MG tablet Take 81 mg by mouth daily.   B-12, Methylcobalamin, 1000 MCG SUBL Place 1 tablet under the tongue daily.   BIOTIN PO Take 1 tablet by mouth daily.   CHROMIUM PO Take 1 tablet by mouth 2 (two) times daily.    Coenzyme Q10 (COQ10) 100 MG CAPS Take 100 mg by mouth daily.   losartan (COZAAR) 50 MG tablet Take 1 tablet (50 mg total) by mouth 2 (two) times daily.   Multiple Vitamins-Minerals (MULTIVITAMIN PO) Take 1 tablet by mouth daily.   Omega-3 Fatty Acids (FISH OIL PO) Take 1 capsule by mouth daily.   ONE TOUCH ULTRA TEST test strip    OVER THE COUNTER MEDICATION Take 1 capsule by mouth daily. Med Name: GYMNEMA SYLVERTRE LEAF   OVER THE COUNTER MEDICATION Med Name: CalMax Mix one (1) packet into water to take by mouth daily at bedtime.   propranolol (INDERAL) 20 MG tablet TAKE 1 TABLET BY MOUTH TWICE A DAY   SYNTHROID 125 MCG tablet Take 125 mcg by mouth daily before breakfast.   Zinc 50 MG TABS Take by mouth daily.    Allergies  Allergen Reactions   Codeine Nausea And Vomiting    unspecified   Cortisone Other (See Comments)    Turns face and skin red, and warm. Makes heart beat out of chest    Sulfa Antibiotics Other (See Comments)    Unspecified.     SOCIAL HISTORY/FAMILY HISTORY   Social History   Tobacco Use   Smoking status: Never Smoker   Smokeless  tobacco: Never Used  Substance Use Topics   Alcohol use: No   Drug use: No   Social History   Social History Narrative   She is a married mother of 1. She tries to walk every day, does not smoke and does not drink.      She has a significant family history coronary disease.    family history includes Arthritis in her mother and sister; Diabetes in her sister and sister; Hashimoto's thyroiditis in her daughter; Heart attack in her father; Heart disease in her sister; Hypothyroidism in her mother and paternal grandfather; Mitral valve prolapse in her mother.   OBJCTIVE -PE, EKG, labs   Wt Readings from Last 3 Encounters:  04/15/19 247 lb (112 kg)  04/10/19 244 lb (110.7 kg)  01/11/18 239 lb 6.4 oz (108.6 kg)    Physical Exam: BP (!) 167/79  Pulse 66    Ht 5\' 2"  (1.575 m)    Wt 247 lb (112 kg)    SpO2 96%    BMI 45.18 kg/m  Physical Exam  Constitutional: She is oriented to person, place, and time. She appears well-developed and well-nourished.  Morbidly obese.  BMI 45.  Well-groomed  HENT:  Head: Normocephalic and atraumatic.  Neck: Normal range of motion. Neck supple. No hepatojugular reflux and no JVD (Difficult to assess) present. Carotid bruit is not present.  Cardiovascular: Normal rate, regular rhythm, S1 normal, S2 normal and intact distal pulses. PMI is not displaced (Unable to palpate). Exam reveals distant heart sounds. Exam reveals no gallop and no friction rub.  Murmur heard. Pulmonary/Chest: Effort normal and breath sounds normal. No respiratory distress. She has no wheezes. She has no rales. She exhibits tenderness (Costochondral).  Mild interstitial sounds  Abdominal: Bowel sounds are normal. She exhibits no distension. There is no abdominal tenderness. There is no rebound.  Obese.  Unable assess HSM  Musculoskeletal: Normal range of motion.        General: No edema (Trivial bilateral LE with small varicose veins).  Neurological: She is alert and oriented to  person, place, and time.  Psychiatric: She has a normal mood and affect. Her behavior is normal. Judgment and thought content normal.  Little less pressured than usual    Adult ECG Report  Rate: 66 ;  Rhythm: normal sinus rhythm and Normal axis, intervals and durations.;   Narrative Interpretation: stable EKG  Recent Labs: None since April 20, 2018-TC 185, TG 104, HDL 56, LDL 109.  A1c 6.0.  ASSESSMENT/PLAN    Problem List Items Addressed This Visit    Morbid obesity with BMI of 40.0-44.9, adult ([place]) (Chronic)    The patient understands the need to lose weight with diet and exercise. We have discussed specific strategies for this. A good portion of the visit was actually encouragement for her to get back and exercise.  She has lots of excuses now especially with the COVID restrictions      Essential hypertension - Primary (Chronic)    Poorly controlled.  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  He is on Inderal for headaches.  Resting heart rate is 66.  No room to titrate this further.  Plan: Increase losartan to 50 mg twice daily.-Next medication would be a diuretic.      Relevant Medications   losartan (COZAAR) 50 MG tablet   Other Relevant Orders   EKG 12-Lead (Completed)   Dyslipidemia (Chronic)    Not currently on statin.  PCP is following her labs.  Would like to see LDL of around 100, but coronary calcium score is relatively reassuring.      Edema of both legs (Chronic)    I will see the Lasix listed.  If this becomes an issue, may want to consider having a standing dose of Lasix versus switching to HCTZ.      Exertional dyspnea    Normal coronary CT angiogram exclude CAD.  She does have grade 2 diastolic function and clearly has hypertensive heart disease related exertional dyspnea complicated by obesity, deconditioning and musculoskeletal pains limiting exercise.  Titrate ARB dose to 50 twice daily.  Continue to exercise and lose  weight.  Consider restarting diuretic      Relevant Medications   losartan (COZAAR) 50 MG tablet   Other Relevant Orders   EKG 12-Lead (Completed)  COVID-19 Education: The signs and symptoms of COVID-19 were discussed with the patient and how to seek care for testing (follow up with PCP or arrange E-visit).   The importance of social distancing was discussed today.  I spent a total of with the patient and chart review. >  50% of the time was spent in direct patient consultation.  Additional time spent with chart review (studies, outside notes, etc): 5 Total Time: 20 min   Current medicines are reviewed at length with the patient today.  (+/- concerns) n/a   Patient Instructions / Medication Changes & Studies & Tests Ordered   Patient Instructions  Medication Instructions:   increase losartan to 50 mg twice a day   *If you need a refill on your cardiac medications before your next appointment, please call your pharmacy*  Lab Work: not needed    Testing/Procedures: Not needed  Follow-Up: At [facility], you and your health needs are our priority.  As part of our continuing mission to provide you with exceptional heart care, we have created designated Provider Care Teams.  These Care Teams include your primary Cardiologist (physician) and Advanced Practice Providers (APPs -  Physician Assistants and Nurse Practitioners) who all work together to provide you with the care you need, when you need it.  Your next appointment:   12 months  The format for your next appointment:   In Person  Provider:   Bryan Lemma, MD  Other Instructions     Studies Ordered:   Orders Placed This Encounter  Procedures   EKG 12-Lead     Bryan Lemma, M.D., M.S. Interventional Cardiologist   Pager # [phone] Phone # [phone] [address] [place], [place] [address]   Thank you for choosing Heartcare at [facility]!!

## 2019-04-15 NOTE — Telephone Encounter (Signed)
Please call to schedule patient for Visco injections with Hazel Sams, PAC.  Schedule for Synvisc series Bilateral Knees. [employer] will cover 80% of allowable amount. Deductible has been met. No Co Pay No PA required.

## 2019-04-17 ENCOUNTER — Telehealth: Payer: Self-pay | Admitting: Rheumatology

## 2019-04-17 NOTE — Telephone Encounter (Signed)
I called patient to schedule her Synvisc injections and during the conversation she mentioned that after her appointment with Dr. Estanislado Pandy on 04/10/19 she tripped coming [place] of the [employer] and fell on her knees.  Patient states "she didn't break anything and had no abrasions" but wanted Dr. Estanislado Pandy to know.

## 2019-04-17 NOTE — Telephone Encounter (Signed)
Called patient to schedule Synvisc injections.  Patient states she is on a "fixed income" and since she is responsible for 20% of the allowable amount wants to wait til after Christmas to schedule.

## 2019-04-21 NOTE — Progress Notes (Deleted)
Office Visit Note  Patient: Rebecca Carey             Date of Birth: 29-Sep-1940           MRN: [id number]             PCP: Wenda Low, MD Referring: Wenda Low, MD Visit Date: 04/30/2019 Occupation: @GUAROCC @  Subjective:  No chief complaint on file.   History of Present Illness: Rebecca Carey is a 78 y.o. female ***   Activities of Daily Living:  Patient reports morning stiffness for *** {minute/hour:19697}.   Patient {ACTIONS;DENIES/REPORTS:21021675::"Denies"} nocturnal pain.  Difficulty dressing/grooming: {ACTIONS;DENIES/REPORTS:21021675::"Denies"} Difficulty climbing stairs: {ACTIONS;DENIES/REPORTS:21021675::"Denies"} Difficulty getting out of chair: {ACTIONS;DENIES/REPORTS:21021675::"Denies"} Difficulty using hands for taps, buttons, cutlery, and/or writing: {ACTIONS;DENIES/REPORTS:21021675::"Denies"}  No Rheumatology ROS completed.   PMFS History:  Patient Active Problem List   Diagnosis Date Noted  . Pulmonary nodules 02/21/2017  . Exertional dyspnea 12/06/2016  . Chest pain with low risk for cardiac etiology 12/11/2012  . Edema of both legs 12/09/2012  . Morbid obesity with BMI of 40.0-44.9, adult ([place])   . Essential hypertension   . Dyslipidemia   . Diabetes mellitus type 2 in obese ([place])   . Hypothyroidism     Past Medical History:  Diagnosis Date  . Diabetes mellitus type 2 in obese ([place])    Using "herbal therapy"  . Dyslipidemia   . Fatty liver   . HTN (hypertension)   . Hypothyroidism   . Morbid obesity with BMI of 45.0-49.9, adult (HCC)    BMI ~45; 5'2" 242 lb    Family History  Problem Relation Age of Onset  . Arthritis Mother   . Mitral valve prolapse Mother   . Hypothyroidism Mother   . Heart attack Father   . Diabetes Sister   . Diabetes Sister   . Arthritis Sister   . Heart disease Sister   . Hypothyroidism Paternal Grandfather   . Hashimoto's thyroiditis Daughter    Past Surgical History:  Procedure Laterality  Date  . CORONARY CALCIUM SCORE AND CARDIAC CT ANGIOGRAM     Coronary calcium score equals 29 (intermediate risk).  CT angiogram to CAD.  Only mild calcified plaque in the LEFT MAIN, and RCA  . NM MYOVIEW LTD  11/06/2012   EF 71%.  No ischemia or infarction.  Anterior defect consistent with breast attenuation  . TRANSTHORACIC ECHOCARDIOGRAM  09/20/2012   normal LV size and function with normal EF  . TRANSTHORACIC ECHOCARDIOGRAM  12/2016   Normal LV size and function with EF 55-60%. GR 2 DD. No visible valvular lesions.   Social History   Social History Narrative   She is a married mother of 1. She tries to walk every day, does not smoke and does not drink.      She has a significant family history coronary disease.    There is no immunization history on file for this patient.   Objective: Vital Signs: There were no vitals taken for this visit.   Physical Exam   Musculoskeletal Exam: ***  CDAI Exam: CDAI Score: - Patient Global: -; Provider Global: - Swollen: -; Tender: - Joint Exam   No joint exam has been documented for this visit   There is currently no information documented on the homunculus. Go to the Rheumatology activity and complete the homunculus joint exam.  Investigation: No additional findings.  Imaging: Xr Knee 3 View Left  Result Date: 04/10/2019 Moderate medial compartment narrowing was noted.  Medial and  lateral osteophytes were noted.  Severe patellofemoral narrowing was noted.  No chondrocalcinosis was noted. 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  Xr Knee 3 View Right  Result Date: 04/10/2019 Moderate lateral compartment narrowing with lateral osteophytes was noted.  Severe patellofemoral narrowing was noted.  No chondrocalcinosis was noted. 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   Recent Labs: Lab Results   Component Value Date   WBC 7.8 04/10/2019   HGB 13.8 04/10/2019   PLT 319 04/10/2019   NA 140 04/10/2019   K 4.8 04/10/2019   CL 103 04/10/2019   CO2 28 04/10/2019   GLUCOSE 104 (H) 04/10/2019   BUN 17 04/10/2019   CREATININE 0.54 (L) 04/10/2019   BILITOT 0.5 04/10/2019   ALKPHOS 66 07/13/2014   AST 17 04/10/2019   ALT 16 04/10/2019   PROT 7.0 04/10/2019   PROT 7.0 04/10/2019   ALBUMIN 3.6 07/13/2014   CALCIUM 9.9 04/10/2019   GFRAA 107 04/10/2019  April 09, 2017 ANA 1: 80 speckled, Ro antibody positive, La antibody positive, double-stranded DNA negative, RF negative, ESR 19, C3-C4 normal  Speciality Comments: No specialty comments available.  Procedures:  No procedures performed Allergies: Codeine, Cortisone, and Sulfa antibiotics   Assessment / Plan:     Visit Diagnoses: No diagnosis found.  Orders: No orders of the defined types were placed in this encounter.  No orders of the defined types were placed in this encounter.   Face-to-face time spent with patient was *** minutes. Greater than 50% of time was spent in counseling and coordination of care.  Follow-Up Instructions: No follow-ups on file.   Bo Merino, MD  Note - This record has been created using Editor, commissioning.  Chart creation errors have been sought, but may not always  have been located. Such creation errors do not reflect on  the standard of medical care.

## 2019-04-22 ENCOUNTER — Encounter: Payer: Self-pay | Admitting: Cardiology

## 2019-04-22 NOTE — Assessment & Plan Note (Signed)
Not currently on statin.  PCP is following her labs.  Would like to see LDL of around 100, but coronary calcium score is relatively reassuring.

## 2019-04-22 NOTE — Assessment & Plan Note (Addendum)
Normal coronary CT angiogram exclude CAD.  She does have grade 2 diastolic function and clearly has hypertensive heart disease related exertional dyspnea complicated by obesity, deconditioning and musculoskeletal pains limiting exercise.  Titrate ARB dose to 50 twice daily.  Continue to exercise and lose weight.  Consider restarting diuretic

## 2019-04-22 NOTE — Assessment & Plan Note (Signed)
Poorly controlled.  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  He is on Inderal for headaches.  Resting heart rate is 66.  No room to titrate this further.  Plan: Increase losartan to 50 mg twice daily.-Next medication would be a diuretic.

## 2019-04-22 NOTE — Assessment & Plan Note (Signed)
I will see the Lasix listed.  If this becomes an issue, may want to consider having a standing dose of Lasix versus switching to HCTZ.

## 2019-04-22 NOTE — Assessment & Plan Note (Addendum)
The patient understands the need to lose weight with diet and exercise. We have discussed specific strategies for this. A good portion of the visit was actually encouragement for her to get back and exercise.  She has lots of excuses now especially with the COVID restrictions

## 2019-04-26 ENCOUNTER — Telehealth: Payer: Self-pay | Admitting: Rheumatology

## 2019-04-26 NOTE — Telephone Encounter (Signed)
Patient has been rescheduled to 06/04/2019 at 11:30am. Per patient,she was tested for COVID 04/25/2019 and currently has a cough, loss of taste/smell and GI upset.

## 2019-04-26 NOTE — Telephone Encounter (Signed)
Patient left a voicemail requesting a return call regarding her labwork and Synvisc injections for her knees.  Patient stated that "she has something she needs to discuss that might affect keeping her follow-up appointment on 04/30/19 at 3:00 pm."

## 2019-04-30 ENCOUNTER — Ambulatory Visit: Payer: Medicare Other | Admitting: Rheumatology

## 2019-05-31 ENCOUNTER — Other Ambulatory Visit: Payer: Self-pay | Admitting: Cardiology

## 2019-05-31 DIAGNOSIS — I10 Essential (primary) hypertension: Secondary | ICD-10-CM

## 2019-05-31 DIAGNOSIS — R0609 Other forms of dyspnea: Secondary | ICD-10-CM

## 2019-05-31 NOTE — Progress Notes (Signed)
Virtual Visit via Telephone Note  I connected with Rebecca Carey on 06/04/19 at 11:30 AM EST by telephone and verified that I am speaking with the correct person using two identifiers.  Location: Patient: Home  Provider: Clinic  This service was conducted via virtual visit. The patient was located at home. I was located in my office.  Consent was obtained prior to the virtual visit and is aware of possible charges through their insurance for this visit.  The patient is an established patient.  Dr. Estanislado Pandy, MD conducted the virtual visit and Hazel Sams, PA-C acted as scribe during the service.  Office staff helped with scheduling follow up visits after the service was conducted.   I discussed the limitations, risks, security and privacy concerns of performing an evaluation and management service by telephone and the availability of in person appointments. I also discussed with the patient that there may be a patient responsible charge related to this service. The patient expressed understanding and agreed to proceed.  CC: Sicca symptoms  History of Present Illness: Patient is a 78 year old female with past medical history of Sjogren's, osteoarthritis, and DDD. She continues to have chronic sicca symptoms. She is using OTC products for symptomatic relief.  She has chronic pain in both knee joints but no joint swelling. She would like to apply for visco injections in both knee joints in the future.   Review of Systems  Constitutional: Positive for malaise/fatigue. Negative for fever.  HENT: Positive for congestion.   Eyes: Negative for photophobia, pain, discharge and redness.  Respiratory: Positive for shortness of breath. Negative for cough and wheezing.   Cardiovascular: Negative for chest pain and palpitations.  Gastrointestinal: Negative for blood in stool, constipation and diarrhea.  Genitourinary: Negative for dysuria.  Musculoskeletal: Positive for joint pain. Negative for back  pain, myalgias and neck pain.  Skin: Negative for rash.  Neurological: Negative for headaches.  Endo/Heme/Allergies: Does not bruise/bleed easily.  Psychiatric/Behavioral: Negative for depression and memory loss. The patient is not nervous/anxious and does not have insomnia.       Observations/Objective: Physical Exam  Constitutional: She is oriented to person, place, and time.  Neurological: She is alert and oriented to person, place, and time.  Psychiatric: Mood, memory, affect and judgment normal.   Patient reports joint stiffness all day Patient reports nocturnal pain.  Difficulty dressing/grooming: Denies Difficulty climbing stairs: Reports Difficulty getting out of chair: Denies Difficulty using hands for taps, buttons, cutlery, and/or writing: Reports  April 10, 2019 CBC normal, CMP normal, ANA 1: 80 speckled, positive SSA, positive SSB, (double-stranded DNA negative, C3-C4 normal), RF negative, ESR 19, SPEP normal  Assessment and Plan: Visit Diagnoses: Sjogren's syndrome with other organ involvement ([place]) - +Ro, +La, +ANA (previous pt in 2014): Lab work from 04/10/19 were reviewed with the patient and all questions were addressed. She has chronic sicca symptoms.  She did want to start on PLQ in the past due to history of underlying Fuch's dystrophy.  She is not a good candidate for pilocarpine due to underlying increased IOP. She is using OTC products for symptomatic relief. She was advised to notify us if she develops any new or worsening symptoms.  She will follow up in 1 year.   Primary osteoarthritis of both knees: Hx of visco x2: She has chronic pain in both knee joints.  She fell on 04/10/19, which worsened her right knee joint pain.  She will notify us when she would like to apply  for visco gel injections.   Primary osteoarthritis of both hips-She is not having any hip joint pain at this time.  Primary osteoarthritis of both hands-She has intermittent pain in both  hands but no inflammation.   DDD (degenerative disc disease), lumbar-She has intermittent lower back pain and stiffness.    Osteopenia, unspecified location-DEXA is done by her PCP and she has osteopenia.  She has been taking calcium and vitamin D.  Other medical conditions are listed as follows:   History of hypothyroidism  Essential hypertension  Dyslipidemia  Fuchs' corneal dystrophy  History of diabetes mellitus, type II-controlled by diet.  Hiatal hernia  MVP (mitral valve prolapse)  Diastolic dysfunction  Follow Up Instructions: She will follow up in 1 year.   I discussed the assessment and treatment plan with the patient. The patient was provided an opportunity to ask questions and all were answered. The patient agreed with the plan and demonstrated an understanding of the instructions.   The patient was advised to call back or seek an in-person evaluation if the symptoms worsen or if the condition fails to improve as anticipated.  I provided 15 minutes of non-face-to-face time during this encounter.   Bo Merino, MD   Scribed by-  Hazel Sams, PA-C

## 2019-06-04 ENCOUNTER — Telehealth (INDEPENDENT_AMBULATORY_CARE_PROVIDER_SITE_OTHER): Payer: Medicare Other | Admitting: Rheumatology

## 2019-06-04 ENCOUNTER — Encounter: Payer: Self-pay | Admitting: Rheumatology

## 2019-06-04 ENCOUNTER — Other Ambulatory Visit: Payer: Self-pay

## 2019-06-04 DIAGNOSIS — M160 Bilateral primary osteoarthritis of hip: Secondary | ICD-10-CM

## 2019-06-04 DIAGNOSIS — M5136 Other intervertebral disc degeneration, lumbar region: Secondary | ICD-10-CM

## 2019-06-04 DIAGNOSIS — M3509 Sicca syndrome with other organ involvement: Secondary | ICD-10-CM | POA: Diagnosis not present

## 2019-06-04 DIAGNOSIS — M19041 Primary osteoarthritis, right hand: Secondary | ICD-10-CM

## 2019-06-04 DIAGNOSIS — M170 Bilateral primary osteoarthritis of knee: Secondary | ICD-10-CM

## 2019-06-04 DIAGNOSIS — M8580 Other specified disorders of bone density and structure, unspecified site: Secondary | ICD-10-CM

## 2019-06-04 DIAGNOSIS — K449 Diaphragmatic hernia without obstruction or gangrene: Secondary | ICD-10-CM

## 2019-06-04 DIAGNOSIS — I10 Essential (primary) hypertension: Secondary | ICD-10-CM

## 2019-06-04 DIAGNOSIS — I341 Nonrheumatic mitral (valve) prolapse: Secondary | ICD-10-CM

## 2019-06-04 DIAGNOSIS — M51369 Other intervertebral disc degeneration, lumbar region without mention of lumbar back pain or lower extremity pain: Secondary | ICD-10-CM

## 2019-06-04 DIAGNOSIS — H18519 Endothelial corneal dystrophy, unspecified eye: Secondary | ICD-10-CM

## 2019-06-04 DIAGNOSIS — Z8639 Personal history of other endocrine, nutritional and metabolic disease: Secondary | ICD-10-CM

## 2019-06-04 DIAGNOSIS — I5189 Other ill-defined heart diseases: Secondary | ICD-10-CM

## 2019-06-04 DIAGNOSIS — M19042 Primary osteoarthritis, left hand: Secondary | ICD-10-CM

## 2019-06-04 DIAGNOSIS — E785 Hyperlipidemia, unspecified: Secondary | ICD-10-CM

## 2019-07-26 IMAGING — CT CT HEART MORP W/ CTA COR W/ SCORE W/ CA W/CM &/OR W/O CM
4 of 7 series · 8 of 20 positions shown, 9 images · non-contrast
Comparison: 12/12/2016

CLINICAL DATA: Chest pain

EXAM:
Cardiac CTA
MEDICATIONS:
Sub lingual nitro. 4mg x 2 and lopressor 2.5mg IV
TECHNIQUE: The patient was scanned on a Siemens [REDACTED]ice scanner. Gantry
rotation speed was 250 msecs. Collimation was 0.8 mm. A 100 kV
prospective scan was triggered in the ascending thoracic aorta at
35-75% of the R-R interval. Average HR during the scan was 60 bpm.
The 3D data set was interpreted on a dedicated work station using
MPR, MIP and VRT modes. A total of 80cc of contrast was used.

[Series 6: best diast 71 % · axial · 0.39mm/px · z∈[+1009,+1070]mm · 2 of 458 slices shown, 3 images]
[im 153/458  vessel]
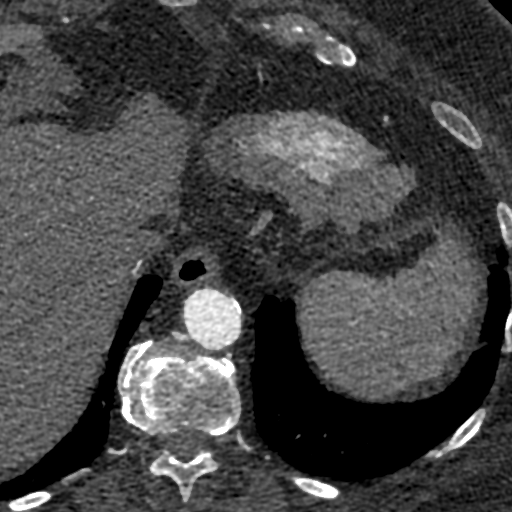
[im 153/458  lung]
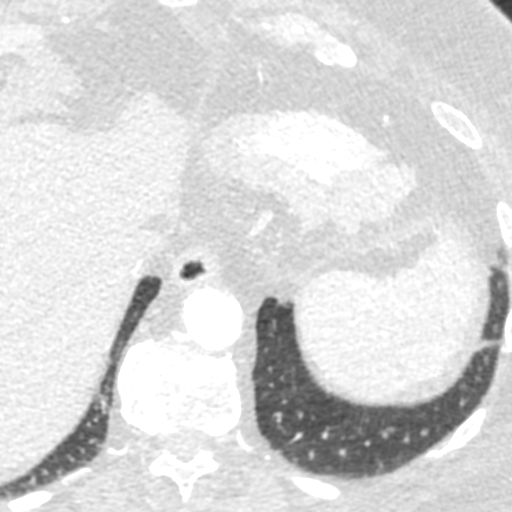
[im 305/458  vessel]
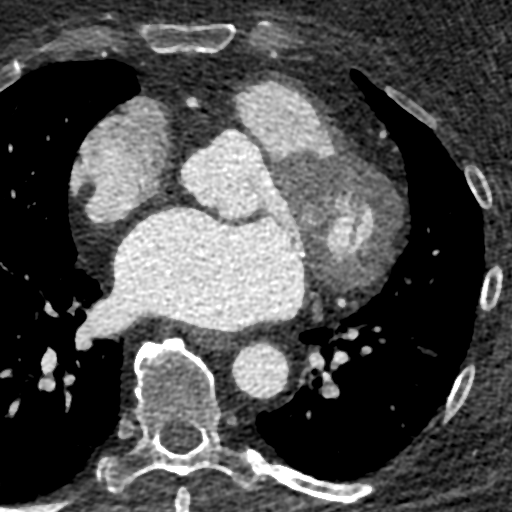

[Series 7: best syst 44 % · axial · 0.39mm/px · z∈[+1009,+1070]mm · 2 of 458 slices shown]
[im 153/458  vessel]
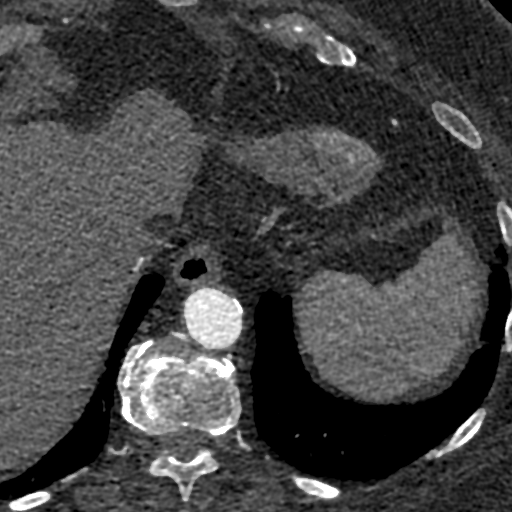
[im 305/458  vessel]
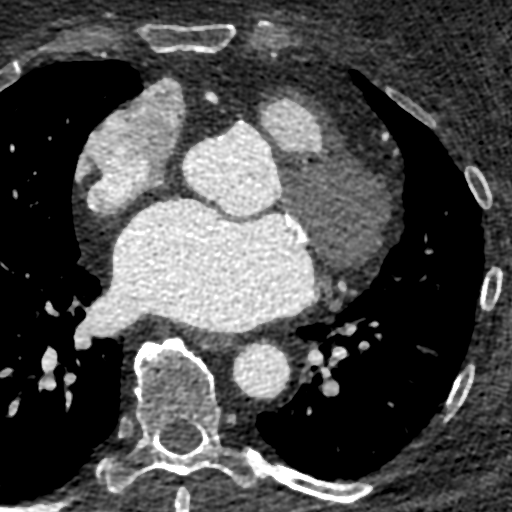

[Series 8: ts diast sharp 71 % · axial · 0.39mm/px · z∈[+1009,+1070]mm · 2 of 458 slices shown]
[im 153/458  lung]
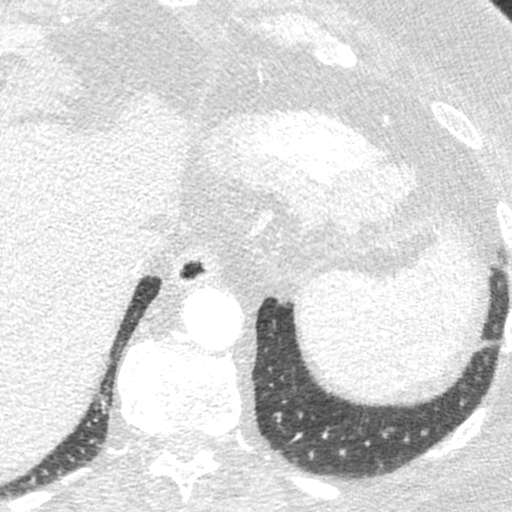
[im 305/458  lung]
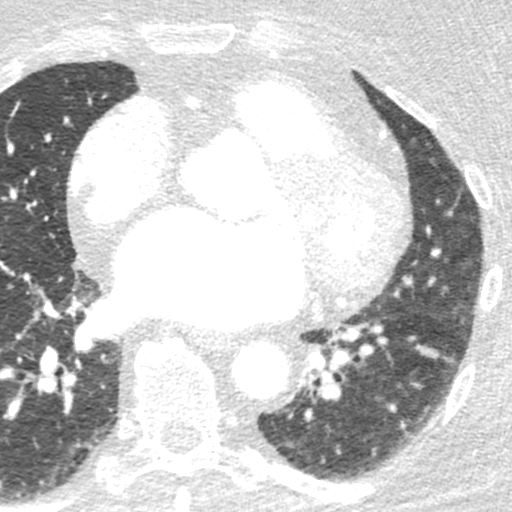

[Series 9: ts syst sharp 44 % · axial · 0.39mm/px · z∈[+1009,+1070]mm · 2 of 458 slices shown]
[im 153/458  lung]
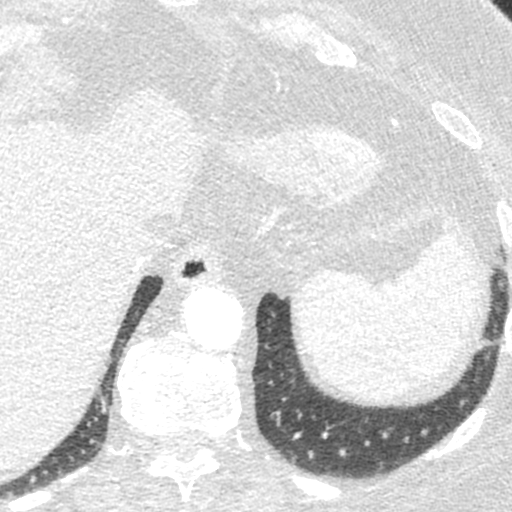
[im 305/458  lung]
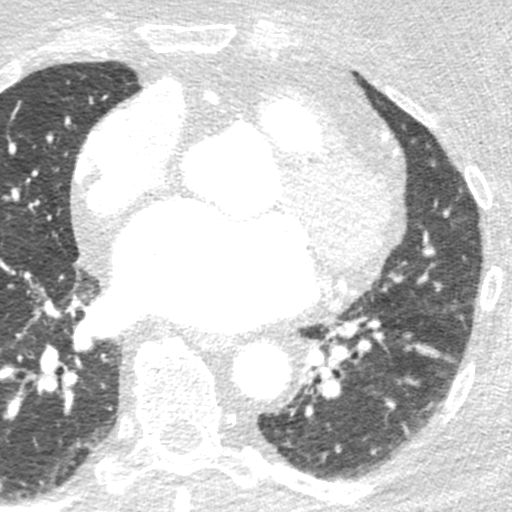

[8 of 20 positions shown; findings below may reference images not displayed]

FINDINGS: Non-cardiac: See separate report from [REDACTED].

Calcium Score: 29 Agatston units.

Coronary Arteries: Right dominant with no anomalies

LM: Calcified plaque proximal LM, no stenosis.

LAD system: No significant plaque or stenosis.

Circumflex system: No significant plaque or stenosis.

RCA system: Calcified plaque ostial RCA, no stenosis.
IMPRESSION: 1. Coronary artery calcium score 29 Agatston units. This places the
patient in the 45th percentile for age and gender, suggesting
intermediate risk for future cardiac events.

2.  No obstructive coronary disease noted.

Joao Mateus Furtado

EXAM:
OVER-READ INTERPRETATION  CT CHEST

The following report is an over-read performed by radiologist Dr.
Natoya Moen [REDACTED] on 01/09/2018. This over-read
does not include interpretation of cardiac or coronary anatomy or
pathology. The coronary CTA interpretation by the cardiologist is
attached.
FINDINGS: Vascular: Scattered descending aortic calcifications. Heart is
borderline in size. Visualized aorta normal caliber.

Mediastinum/Nodes: No adenopathy in the lower mediastinum or hila.

Lungs/Pleura: Visualized lungs clear.  No effusions.

Upper Abdomen: Calcifications centrally in the liver. No acute
findings.

Musculoskeletal: Chest wall soft tissues are unremarkable. No acute
bony abnormality.
IMPRESSION: No acute extra cardiac abnormality.

Descending aortic atherosclerosis.

## 2019-10-17 ENCOUNTER — Other Ambulatory Visit: Payer: Self-pay | Admitting: Internal Medicine

## 2019-10-17 DIAGNOSIS — Z1231 Encounter for screening mammogram for malignant neoplasm of breast: Secondary | ICD-10-CM

## 2019-10-22 ENCOUNTER — Other Ambulatory Visit: Payer: Self-pay | Admitting: Cardiology

## 2019-11-19 ENCOUNTER — Ambulatory Visit
Admission: RE | Admit: 2019-11-19 | Discharge: 2019-11-19 | Disposition: A | Discharge status: Home / Self Care | Payer: Medicare Other | Source: Ambulatory Visit | Attending: Internal Medicine | Admitting: Internal Medicine

## 2019-11-19 ENCOUNTER — Other Ambulatory Visit: Payer: Self-pay

## 2019-11-19 DIAGNOSIS — Z1231 Encounter for screening mammogram for malignant neoplasm of breast: Secondary | ICD-10-CM

## 2020-03-12 ENCOUNTER — Telehealth: Payer: Self-pay | Admitting: Cardiology

## 2020-03-12 NOTE — Telephone Encounter (Signed)
lvm for patient to return call to get follow up scheduled with [facility] from recall list 

## 2020-05-17 ENCOUNTER — Other Ambulatory Visit: Payer: Self-pay | Admitting: Cardiology

## 2020-05-19 NOTE — Progress Notes (Signed)
Office Visit Note  Patient: Rebecca Carey             Date of Birth: 1941-03-31           MRN: [id number]             PCP: Georgann Housekeeper, MD Referring: Georgann Housekeeper, MD Visit Date: 06/02/2020 Occupation: @GUAROCC @  Subjective:  Other (Right knee pain, plantar fasciitis pain )   History of Present Illness: Rebecca MCARTHY is a 79 y.o. female with history of Sjogren, osteoarthritis, degenerative disc disease.  She states she has dry mouth and dry eyes for which she has been using over-the-counter products.  She has been having discomfort in her right knee joint.  She has been also having discomfort in her right foot.  She states her PCP told her that she may have plantar fasciitis.  She has not made an appointment with the podiatrist yet.  She continues to have some stiffness in her hands and her hip joints.  He has off-and-on discomfort in her lower back  Activities of Daily Living:  Patient reports morning stiffness for 1 hour.   Patient Reports nocturnal pain.  Difficulty dressing/grooming: Denies Difficulty climbing stairs: Reports Difficulty getting out of chair: Reports Difficulty using hands for taps, buttons, cutlery, and/or writing: Reports  Review of Systems  Constitutional: Positive for fatigue.  HENT: Positive for mouth dryness. Negative for mouth sores and nose dryness.   Eyes: Positive for dryness. Negative for pain and itching.  Respiratory: Negative for shortness of breath and difficulty breathing.   Cardiovascular: Negative for chest pain and palpitations.  Gastrointestinal: Positive for constipation. Negative for blood in stool and diarrhea.  Endocrine: Positive for increased urination.  Genitourinary: Negative for difficulty urinating.  Musculoskeletal: Positive for arthralgias, joint pain, joint swelling, myalgias, morning stiffness, muscle tenderness and myalgias.  Skin: Negative for color change, rash and redness.  Allergic/Immunologic: Negative  for susceptible to infections.  Neurological: Positive for dizziness. Negative for numbness, headaches, memory loss and weakness.  Hematological: Negative for bruising/bleeding tendency.  Psychiatric/Behavioral: Negative for confusion.    PMFS History:  Patient Active Problem List   Diagnosis Date Noted  . Pulmonary nodules 02/21/2017  . Exertional dyspnea 12/06/2016  . Chest pain with low risk for cardiac etiology 12/11/2012  . Edema of both legs 12/09/2012  . Morbid obesity with BMI of 40.0-44.9, adult (HCC)   . Essential hypertension   . Dyslipidemia   . Diabetes mellitus type 2 in obese (HCC)   . Hypothyroidism     Past Medical History:  Diagnosis Date  . Diabetes mellitus type 2 in obese (HCC)    Using "herbal therapy"  . Dyslipidemia   . Fatty liver   . HTN (hypertension)   . Hypothyroidism   . Morbid obesity with BMI of 45.0-49.9, adult (HCC)    BMI ~45; 5'2" 242 lb  . Plantar fasciitis    per patient     Family History  Problem Relation Age of Onset  . Arthritis Mother   . Mitral valve prolapse Mother   . Hypothyroidism Mother   . Heart attack Father   . Diabetes Sister   . Diabetes Sister   . Arthritis Sister   . Heart disease Sister   . COPD Sister   . Hypothyroidism Paternal Grandfather   . Hashimoto's thyroiditis Daughter    Past Surgical History:  Procedure Laterality Date  . ABDOMINAL HYSTERECTOMY    . CORONARY CALCIUM SCORE AND CARDIAC CT  ANGIOGRAM     Coronary calcium score equals 29 (intermediate risk).  CT angiogram to CAD.  Only mild calcified plaque in the LEFT MAIN, and RCA  . NM MYOVIEW LTD  11/06/2012   EF 71%.  No ischemia or infarction.  Anterior defect consistent with breast attenuation  . TRANSTHORACIC ECHOCARDIOGRAM  09/20/2012   normal LV size and function with normal EF  . TRANSTHORACIC ECHOCARDIOGRAM  12/2016   Normal LV size and function with EF 55-60%. GR 2 DD. No visible valvular lesions.   Social History   Social History  Narrative   She is a married mother of 1. She tries to walk every day, does not smoke and does not drink.      She has a significant family history coronary disease.    There is no immunization history on file for this patient.   Objective: Vital Signs: BP (!) 169/85 (BP Location: Left Arm, Patient Position: Sitting, Cuff Size: Large)   Pulse 62   Resp 17   Ht 5' 1.5" (1.562 m)   Wt 242 lb (109.8 kg)   BMI 44.99 kg/m    Physical Exam Vitals and nursing note reviewed.  Constitutional:      Appearance: She is well-developed and well-nourished.  HENT:     Head: Normocephalic and atraumatic.  Eyes:     Extraocular Movements: EOM normal.     Conjunctiva/sclera: Conjunctivae normal.  Cardiovascular:     Rate and Rhythm: Normal rate and regular rhythm.     Pulses: Intact distal pulses.     Heart sounds: Normal heart sounds.  Pulmonary:     Effort: Pulmonary effort is normal.     Breath sounds: Normal breath sounds.  Abdominal:     General: Bowel sounds are normal.     Palpations: Abdomen is soft.  Musculoskeletal:     Cervical back: Normal range of motion.  Lymphadenopathy:     Cervical: No cervical adenopathy.  Skin:    General: Skin is warm and dry.     Capillary Refill: Capillary refill takes less than 2 seconds.  Neurological:     Mental Status: She is alert and oriented to person, place, and time.  Psychiatric:        Mood and Affect: Mood and affect normal.        Behavior: Behavior normal.      Musculoskeletal Exam: C-spine was in good range of motion.  Shoulder joints and elbow joints with good range of motion.  She had no synovitis over MCPs PIPs or DIPs.  She has DIP and PIP thickening consistent with osteoarthritis.  Hip joints were difficult to assess in the sitting position.  Knee joints with good range of motion.  No warmth swelling or effusion was noted.  She discomfort range of motion of her right knee joint.  She had tenderness over the right plantar  fascia in her heel.  CDAI Exam: CDAI Score: -- Patient Global: --; Provider Global: -- Swollen: --; Tender: -- Joint Exam 06/02/2020   No joint exam has been documented for this visit   There is currently no information documented on the homunculus. Go to the Rheumatology activity and complete the homunculus joint exam.  Investigation: No additional findings.  Imaging: No results found.  Recent Labs: Lab Results  Component Value Date   WBC 7.8 04/10/2019   HGB 13.8 04/10/2019   PLT 319 04/10/2019   NA 140 04/10/2019   K 4.8 04/10/2019   CL 103 04/10/2019  CO2 28 04/10/2019   GLUCOSE 104 (H) 04/10/2019   BUN 17 04/10/2019   CREATININE 0.54 (L) 04/10/2019   BILITOT 0.5 04/10/2019   ALKPHOS 66 07/13/2014   AST 17 04/10/2019   ALT 16 04/10/2019   PROT 7.0 04/10/2019   PROT 7.0 04/10/2019   ALBUMIN 3.6 07/13/2014   CALCIUM 9.9 04/10/2019   GFRAA 107 04/10/2019    Speciality Comments: No specialty comments available.  Procedures:  No procedures performed Allergies: Codeine, Cortisone, Erythromycin, Losartan potassium, and Sulfa antibiotics   Assessment / Plan:     Visit Diagnoses: Sjogren's syndrome with other organ involvement (HCC) - +Ro, +La, +ANA (previous pt in 2014):  -She continues to have sicca symptoms.  Over-the-counter products were discussed.  Plan: CBC with Differential/Platelet, COMPLETE METABOLIC PANEL WITH GFR, Urinalysis, Routine w reflex microscopic  Primary osteoarthritis of both hands-joint protection muscle strengthening was discussed.  Primary osteoarthritis of both hips-she continues to have some discomfort in her hips.  Primary osteoarthritis of both knees -she had Hx of visco x2: -She complains of increased pain and discomfort in her right knee joint.  Plan: XR KNEE 3 VIEW RIGHT.  X-rays show severe osteoarthritis and severe chondromalacia patella.  Radiographic progression was noted.  She is not ready to have total knee replacement yet.  We  will proceed with Visco supplement injections.  Plantar fasciitis of right foot-she has been having increased discomfort in her right foot due to plantar fasciitis.  She states she does not want to see a podiatrist.  She will try stretching exercises herself.  Given her a handout on plantar fasciitis exercises.  DDD (degenerative disc disease), lumbar-she continues to have some lower back pain.  Osteopenia, unspecified location - DEXA is done by her PCP and she has osteopenia.   Essential hypertension-blood pressure is elevated today.  Which she relates to her diet.  History of hypothyroidism  Dyslipidemia  History of diabetes mellitus, type II  MVP (mitral valve prolapse)  Hiatal hernia  Diastolic dysfunction  Fuchs' corneal dystrophy, unspecified laterality  Orders: Orders Placed This Encounter  Procedures  . XR KNEE 3 VIEW RIGHT  . CBC with Differential/Platelet  . COMPLETE METABOLIC PANEL WITH GFR  . Urinalysis, Routine w reflex microscopic   No orders of the defined types were placed in this encounter.   Follow-Up Instructions: Return in about 5 months (around 10/31/2020) for Sjogren's, Osteoarthritis.   Pollyann Savoy, MD  Note - This record has been created using Animal nutritionist.  Chart creation errors have been sought, but may not always  have been located. Such creation errors do not reflect on  the standard of medical care.

## 2020-05-21 ENCOUNTER — Other Ambulatory Visit: Payer: Self-pay | Admitting: Internal Medicine

## 2020-05-21 DIAGNOSIS — R109 Unspecified abdominal pain: Secondary | ICD-10-CM

## 2020-06-02 ENCOUNTER — Telehealth: Payer: Self-pay

## 2020-06-02 ENCOUNTER — Encounter: Payer: Self-pay | Admitting: Rheumatology

## 2020-06-02 ENCOUNTER — Other Ambulatory Visit: Payer: Self-pay

## 2020-06-02 ENCOUNTER — Ambulatory Visit: Payer: Self-pay

## 2020-06-02 ENCOUNTER — Ambulatory Visit: Payer: Medicare Other | Admitting: Rheumatology

## 2020-06-02 VITALS — BP 157/76 | HR 59 | Resp 17 | Ht 61.5 in | Wt 242.0 lb

## 2020-06-02 DIAGNOSIS — M19041 Primary osteoarthritis, right hand: Secondary | ICD-10-CM | POA: Diagnosis not present

## 2020-06-02 DIAGNOSIS — I10 Essential (primary) hypertension: Secondary | ICD-10-CM

## 2020-06-02 DIAGNOSIS — M8580 Other specified disorders of bone density and structure, unspecified site: Secondary | ICD-10-CM

## 2020-06-02 DIAGNOSIS — M160 Bilateral primary osteoarthritis of hip: Secondary | ICD-10-CM | POA: Diagnosis not present

## 2020-06-02 DIAGNOSIS — I341 Nonrheumatic mitral (valve) prolapse: Secondary | ICD-10-CM

## 2020-06-02 DIAGNOSIS — M19042 Primary osteoarthritis, left hand: Secondary | ICD-10-CM

## 2020-06-02 DIAGNOSIS — M3509 Sicca syndrome with other organ involvement: Secondary | ICD-10-CM | POA: Diagnosis not present

## 2020-06-02 DIAGNOSIS — Z8639 Personal history of other endocrine, nutritional and metabolic disease: Secondary | ICD-10-CM

## 2020-06-02 DIAGNOSIS — H18519 Endothelial corneal dystrophy, unspecified eye: Secondary | ICD-10-CM

## 2020-06-02 DIAGNOSIS — M722 Plantar fascial fibromatosis: Secondary | ICD-10-CM

## 2020-06-02 DIAGNOSIS — M5136 Other intervertebral disc degeneration, lumbar region: Secondary | ICD-10-CM

## 2020-06-02 DIAGNOSIS — K449 Diaphragmatic hernia without obstruction or gangrene: Secondary | ICD-10-CM

## 2020-06-02 DIAGNOSIS — E785 Hyperlipidemia, unspecified: Secondary | ICD-10-CM

## 2020-06-02 DIAGNOSIS — I5189 Other ill-defined heart diseases: Secondary | ICD-10-CM

## 2020-06-02 DIAGNOSIS — M170 Bilateral primary osteoarthritis of knee: Secondary | ICD-10-CM | POA: Diagnosis not present

## 2020-06-02 DIAGNOSIS — M51369 Other intervertebral disc degeneration, lumbar region without mention of lumbar back pain or lower extremity pain: Secondary | ICD-10-CM

## 2020-06-02 NOTE — Telephone Encounter (Addendum)
Please apply for right knee visco, per Taylor Dale, PA-C. Thanks!  

## 2020-06-02 NOTE — Patient Instructions (Signed)
Plantar Fasciitis Rehab Ask your health care provider which exercises are safe for you. Do exercises exactly as told by your health care provider and adjust them as directed. It is normal to feel mild stretching, pulling, tightness, or discomfort as you do these exercises. Stop right away if you feel sudden pain or your pain gets worse. Do not begin these exercises until told by your health care provider. Stretching and range-of-motion exercises These exercises warm up your muscles and joints and improve the movement and flexibility of your foot. These exercises also help to relieve pain. Plantar fascia stretch  1. Sit with your left / right leg crossed over your opposite knee. 2. Hold your heel with one hand with that thumb near your arch. With your other hand, hold your toes and gently pull them back toward the top of your foot. You should feel a stretch on the bottom of your toes or your foot (plantar fascia) or both. 3. Hold this stretch for__________ seconds. 4. Slowly release your toes and return to the starting position. Repeat __________ times. Complete this exercise __________ times a day. Gastrocnemius stretch, standing This exercise is also called a calf (gastroc) stretch. It stretches the muscles in the back of the upper calf. 1. Stand with your hands against a wall. 2. Extend your left / right leg behind you, and bend your front knee slightly. 3. Keeping your heels on the floor and your back knee straight, shift your weight toward the wall. Do not arch your back. You should feel a gentle stretch in your upper left / right calf. 4. Hold this position for __________ seconds. Repeat __________ times. Complete this exercise __________ times a day. Soleus stretch, standing This exercise is also called a calf (soleus) stretch. It stretches the muscles in the back of the lower calf. 1. Stand with your hands against a wall. 2. Extend your left / right leg behind you, and bend your front  knee slightly. 3. Keeping your heels on the floor, bend your back knee and shift your weight slightly over your back leg. You should feel a gentle stretch deep in your lower calf. 4. Hold this position for __________ seconds. Repeat __________ times. Complete this exercise __________ times a day. Gastroc and soleus stretch, standing step This exercise stretches the muscles in the back of the lower leg. These muscles are in the upper calf (gastrocnemius) and the lower calf (soleus). 1. Stand with the ball of your left / right foot on a step. The ball of your foot is on the walking surface, right under your toes. 2. Keep your other foot firmly on the same step. 3. Hold on to the wall or a railing for balance. 4. Slowly lift your other foot, allowing your body weight to press your left / right heel down over the edge of the step. You should feel a stretch in your left / right calf. 5. Hold this position for __________ seconds. 6. Return both feet to the step. 7. Repeat this exercise with a slight bend in your left / right knee. Repeat __________ times with your left / right knee straight and __________ times with your left / right knee bent. Complete this exercise __________ times a day. Balance exercise This exercise builds your balance and strength control of your arch to help take pressure off your plantar fascia. Single leg stand If this exercise is too easy, you can try it with your eyes closed or while standing on a pillow. 1.   Without shoes, stand near a railing or in a doorway. You may hold on to the railing or door frame as needed. 2. Stand on your left / right foot. Keep your big toe down on the floor and try to keep your arch lifted. Do not let your foot roll inward. 3. Hold this position for __________ seconds. Repeat __________ times. Complete this exercise __________ times a day. This information is not intended to replace advice given to you by your health care provider. Make sure  you discuss any questions you have with your health care provider. Document Revised: 09/20/2018 Document Reviewed: 03/28/2018 Elsevier Patient Education  2020 [employer]  

## 2020-06-03 LAB — URINALYSIS, ROUTINE W REFLEX MICROSCOPIC
Bilirubin Urine: NEGATIVE
Glucose, UA: NEGATIVE
Hgb urine dipstick: NEGATIVE
Ketones, ur: NEGATIVE
Leukocytes,Ua: NEGATIVE
Nitrite: NEGATIVE
Protein, ur: NEGATIVE
Specific Gravity, Urine: 1.007 (ref 1.001–1.03)
pH: >=8.5 — AB (ref 5.0–8.0)

## 2020-06-03 LAB — CBC WITH DIFFERENTIAL/PLATELET
Absolute Monocytes: 562 cells/uL (ref 200–950)
Basophils Absolute: 59 cells/uL (ref 0–200)
Basophils Relative: 0.8 %
Eosinophils Absolute: 244 cells/uL (ref 15–500)
Eosinophils Relative: 3.3 %
HCT: 42.7 % (ref 35.0–45.0)
Hemoglobin: 14.1 g/dL (ref 11.7–15.5)
Lymphs Abs: 1998 cells/uL (ref 850–3900)
MCH: 29.4 pg (ref 27.0–33.0)
MCHC: 33 g/dL (ref 32.0–36.0)
MCV: 89.1 fL (ref 80.0–100.0)
MPV: 10.5 fL (ref 7.5–12.5)
Monocytes Relative: 7.6 %
Neutro Abs: 4536 cells/uL (ref 1500–7800)
Neutrophils Relative %: 61.3 %
Platelets: 343 10*3/uL (ref 140–400)
RBC: 4.79 10*6/uL (ref 3.80–5.10)
RDW: 12.6 % (ref 11.0–15.0)
Total Lymphocyte: 27 %
WBC: 7.4 10*3/uL (ref 3.8–10.8)

## 2020-06-03 LAB — COMPLETE METABOLIC PANEL WITH GFR
AG Ratio: 1.6 (calc) (ref 1.0–2.5)
ALT: 19 U/L (ref 6–29)
AST: 18 U/L (ref 10–35)
Albumin: 4.3 g/dL (ref 3.6–5.1)
Alkaline phosphatase (APISO): 87 U/L (ref 37–153)
BUN/Creatinine Ratio: 30 (calc) — ABNORMAL HIGH (ref 6–22)
BUN: 16 mg/dL (ref 7–25)
CO2: 31 mmol/L (ref 20–32)
Calcium: 10.1 mg/dL (ref 8.6–10.4)
Chloride: 101 mmol/L (ref 98–110)
Creat: 0.54 mg/dL — ABNORMAL LOW (ref 0.60–0.93)
GFR, Est African American: 106 mL/min/{1.73_m2} (ref >=60)
GFR, Est Non African American: 92 mL/min/{1.73_m2} (ref >=60)
Globulin: 2.7 g/dL (calc) (ref 1.9–3.7)
Glucose, Bld: 96 mg/dL (ref 65–99)
Potassium: 4.7 mmol/L (ref 3.5–5.3)
Sodium: 140 mmol/L (ref 135–146)
Total Bilirubin: 0.6 mg/dL (ref 0.2–1.2)
Total Protein: 7 g/dL (ref 6.1–8.1)

## 2020-06-03 NOTE — Progress Notes (Signed)
CBC  CMP and UA are normal

## 2020-06-04 NOTE — Telephone Encounter (Signed)
I spoke with patient, and advised her we would call to verify benefits after the first of the year.

## 2020-06-16 ENCOUNTER — Ambulatory Visit: Payer: Medicare Other | Admitting: Cardiology

## 2020-06-19 NOTE — Telephone Encounter (Signed)
Submitted for VOB 06/19/2020.

## 2020-06-24 NOTE — Telephone Encounter (Signed)
Please call to schedule Visco injections.  Authorized for Synvisc series, right knee.  Buy and Bill. No PA required. Deductible does not apply. Insurance to cover 100% of allowable cost of injection with a $35.00 copay each visit, and 80% of allowable cost for medication.

## 2020-06-25 NOTE — Telephone Encounter (Signed)
I LMOM for patient to call to discuss benefits, and schedule Visco appointments.

## 2020-06-27 ENCOUNTER — Other Ambulatory Visit: Payer: Medicare Other

## 2020-07-06 ENCOUNTER — Telehealth: Payer: Self-pay | Admitting: Rheumatology

## 2020-07-06 NOTE — Telephone Encounter (Signed)
Please call patient and schedule appt to discuss. Thank you.

## 2020-07-06 NOTE — Telephone Encounter (Signed)
I spoke with patient in regards to Visco for right knee. Patient is not ready to schedule injections. Patient would like Dr. Corliss Skains to look at [facility], and discuss symptoms she is having with her knee. Patient is not sure injections would help. Her pain is not all the time, and it is more of a catching / popping feeling. Per patient, she feels the pain / catching sensation on the lateral side of her knee, and only when she turns her knee, moves a certain way in bed, or stands from a sitting position. Patient requests a call back to discuss.

## 2020-07-06 NOTE — Telephone Encounter (Signed)
I spoke with patient about scheduling a sooner appointment. Per patient, she really does not want to come out right now do to COVID increase. Patient is also having some increased heart issues, and wants to take care of that first before her knee. Patient will keep 10/27/2020 appointment for now, and call to schedule sooner if other issues get better.

## 2020-07-10 ENCOUNTER — Other Ambulatory Visit: Payer: Medicare Other

## 2020-08-14 ENCOUNTER — Other Ambulatory Visit: Payer: Self-pay

## 2020-08-14 ENCOUNTER — Ambulatory Visit: Payer: Medicare Other | Admitting: Cardiology

## 2020-08-14 VITALS — BP 132/78 | HR 63 | Ht 61.5 in | Wt 239.0 lb

## 2020-08-14 DIAGNOSIS — E785 Hyperlipidemia, unspecified: Secondary | ICD-10-CM | POA: Diagnosis not present

## 2020-08-14 DIAGNOSIS — I10 Essential (primary) hypertension: Secondary | ICD-10-CM

## 2020-08-14 DIAGNOSIS — R600 Localized edema: Secondary | ICD-10-CM

## 2020-08-14 DIAGNOSIS — R0609 Other forms of dyspnea: Secondary | ICD-10-CM

## 2020-08-14 DIAGNOSIS — Z6841 Body Mass Index (BMI) 40.0 and over, adult: Secondary | ICD-10-CM

## 2020-08-14 DIAGNOSIS — R079 Chest pain, unspecified: Secondary | ICD-10-CM

## 2020-08-14 DIAGNOSIS — R0600 Dyspnea, unspecified: Secondary | ICD-10-CM | POA: Diagnosis not present

## 2020-08-14 DIAGNOSIS — I5189 Other ill-defined heart diseases: Secondary | ICD-10-CM | POA: Insufficient documentation

## 2020-08-14 DIAGNOSIS — E1169 Type 2 diabetes mellitus with other specified complication: Secondary | ICD-10-CM

## 2020-08-14 MED ORDER — LOSARTAN POTASSIUM 25 MG PO TABS: 25.0000 mg | ORAL_TABLET | Freq: Two times a day (BID) | ORAL | 3 refills | Status: DC

## 2020-08-14 NOTE — Patient Instructions (Signed)
Medication Instructions:    losartan 25 mg twice a day   *If you need a refill on your cardiac medications before your next appointment, please call your pharmacy*   Lab Work: Not needed   Testing/Procedures: Not needed  Follow-Up: At [facility], you and your health needs are our priority.  As part of our continuing mission to provide you with exceptional heart care, we have created designated Provider Care Teams.  These Care Teams include your primary Cardiologist (physician) and Advanced Practice Providers (APPs -  Physician Assistants and Nurse Practitioners) who all work together to provide you with the care you need, when you need it.     Your next appointment:   12 month(s)  The format for your next appointment:   In Person  Provider:   Bryan Lemma, MD  Other  Try to walk with you can use a rolling walker or a cane

## 2020-08-14 NOTE — Progress Notes (Signed)
Primary Care Provider: Husain, Karrar, MD Cardiologist: No Georgann Housekeeperprimary care provider on file. Electrophysiologist: None  Clinic Note: Chief Complaint  Patient presents with  . Follow-up    Multiple complaints and concerns-diffuse myalgias and arthralgias.  Concerns about medications Diffuse chest pains   ===================================  ASSESSMENT/PLAN   Problem List Items Addressed This Visit    Morbid obesity with BMI of 40.0-44.9, adult (HCC) (Chronic)    She has lost some weight, but still has a BMI of 44.  She needs to get out do some walking.  With all of her myalgias I recommend that she walk with a cane or a walker.  This will help her with her balance.  I am just fearful of using a walker causing worsening costochondritis pain      Essential hypertension - Primary (Chronic)    Her blood pressure is well controlled, but she is very concerned about losartan.  I agreed to allow her to simply stop taking it.  She did not tolerate diuretic, and has not been on the 50 mg twice daily that I prescribed.  Plan for now be to back off losartan to 25 mg daily and reassess symptoms.      Relevant Medications   losartan (COZAAR) 25 MG tablet   Other Relevant Orders   EKG 12-Lead (Completed)   Hyperlipidemia associated with type 2 diabetes mellitus (HCC) (Chronic)    Remains off statin.  Has not had labs checked in a year.  LDL is 114.  Target should at least be less than 100.  Hopefully with weight loss this will improve.  She is taking fish oil.  Could consider Zetia or Nexletol-however with her current trend mind, I did not want to start any medication.      Relevant Medications   losartan (COZAAR) 25 MG tablet   Chest pain with low risk for cardiac etiology (Chronic)    Normal coronary CTA back in 2018.  Her chest pain still seems to be musculoskeletal in nature.  She has reproducible costochondritis symptoms on exam. => She is on a beta-blocker and ARB along with aspirin.   Reluctant to take anything for hyperlipidemia.  I talked about using as needed NSAIDs for chest pain.      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  No real PND or orthopnea.  There is diastolic function noted on echo, but she is truly only having class I true CHF symptoms.      Relevant Orders   EKG 12-Lead (Completed)   Edema of both legs (Chronic)    She indicates that she was not able to tolerate taking a diuretic because of overactive bladder.  Recommend foot elevation, and support stockings      Exertional dyspnea (Chronic)    If there is a diastolic dysfunction component to this, I would have expected to improve with increasing the ARB.  I think a lot of this is deconditioning, obesity and musculoskeletal pains with exercise.      Relevant Orders   EKG 12-Lead (Completed)      ===================================  HPI:    Rebecca Carey is a 80 y.o. female with a PMH notable for With Hypertensive Heart Disease/HFpEF (NYHA class I-II-not treated for heart failure),  MORBID OBESITY, DM-2, and HLD with Low Coronary Calcium Score of 31 who presents today for 1727-month follow-up.  Rebecca AlexandersDorothy S Carey was last seen on  April 15, 2019-we followed up her lower extremity venous Doppler showed no DVT.  She actually was doing fine at this time no major complaints.  Just some dyspnea during the heat of September.  Her legs ache all the time.  Weight was going up.  Still lots of musculoskeletal chest pain but stable.  Costochondral exam. => My recommendation was that increase losartan to twice daily 50 mg.  (That did not happen-she remains on 50 mg daily)  Recent Hospitalizations: None  Reviewed  CV studies:    The following studies were reviewed today: (if available, images/films reviewed: From Epic Chart or Care Everywhere) . None:  Interval History:   Rebecca Carey  returns here today with lots of complaints.  She is just very upset and depressed with the pandemic and the Covid restrictions.  She has been much less active, not doing hardly any exercise.  She says she is off balance, her knee still hurts most of the right side.  She has been reading about side effects of medications and is concerned about the side effect effects of losartan, she is try to figure out reason for her muscle aches.  Her legs ache all the time. We got into her discussion about medication side effects.  She was inferring comments about potential side effects with her symptoms.  She was noting that medicine is called losartan potassium, and thinking that this was affecting her potassium level.  Effective does, but is not the same way she is thinking. She has been reluctant to use diuretic because of her overactive bladder.  She is quite deconditioned and has exertional dyspnea but not at rest.  She has chronic intermittent episodes of chest discomfort that been ongoing.  No real chest pressure associated with dyspnea that occurs with exertion, but she does not exert.  She has mild stable edema but no real PND orthopnea.  She does have exertional dyspnea but not associate with chest pain  CV Review of Symptoms (Summary): positive for - chest pain, dyspnea on exertion, edema and Diffuse myalgias. negative for - irregular heartbeat, orthopnea, palpitations, paroxysmal nocturnal dyspnea, rapid heart rate, shortness of breath or Although she has some lightheadedness and poor balance, she denies any syncope or near syncope, TIA/amaurosis fugax or claudication.  The patient does not have symptoms concerning for COVID-19 infection (fever, chills, cough, or new shortness of breath).   REVIEWED OF SYSTEMS   Review of Systems  Constitutional: Positive for malaise/fatigue and weight loss (She has lost some weight by cutting down p.o. intake, trying to do some exercise.).  HENT: Negative for  congestion.   Respiratory: Positive for shortness of breath (Related to deconditioning).   Cardiovascular: Positive for leg swelling.  Gastrointestinal: Negative for blood in stool.  Genitourinary: Positive for frequency (Overactive bladder, unable to tolerate diuretic.). Negative for hematuria.  Musculoskeletal: Positive for joint pain and myalgias (Diffuse upper and lower extremities). Negative for falls.  Psychiatric/Behavioral: Positive for depression. Negative for memory loss. The patient is nervous/anxious (Notably anxious and upset today.).     PAST MEDICAL HISTORY   Past Medical History:  Diagnosis Date  . Diabetes mellitus type 2 in obese (HCC)    Using "herbal therapy"  . Dyslipidemia   . Fatty liver   . HTN (hypertension)   . Hypothyroidism   . Morbid obesity with BMI of 45.0-49.9, adult (HCC)    BMI ~45; 5'2" 242 lb  . Plantar fasciitis    per patient  PAST SURGICAL HISTORY   Past Surgical History:  Procedure Laterality Date  . ABDOMINAL HYSTERECTOMY    . CORONARY CALCIUM SCORE AND CARDIAC CT ANGIOGRAM     Coronary calcium score equals 29 (intermediate risk).  CT angiogram to CAD.  Only mild calcified plaque in the LEFT MAIN, and RCA  . NM MYOVIEW LTD  11/06/2012   EF 71%.  No ischemia or infarction.  Anterior defect consistent with breast attenuation  . TRANSTHORACIC ECHOCARDIOGRAM  09/20/2012   normal LV size and function with normal EF  . TRANSTHORACIC ECHOCARDIOGRAM  12/2016   Normal LV size and function with EF 55-60%. GR 2 DD. No visible valvular lesions.    There is no immunization history on file for this patient.  MEDICATIONS/ALLERGIES   Current Meds  Medication Sig  . ALPHA LIPOIC ACID PO Take 1 tablet by mouth daily.   Marland Kitchen ALPRAZolam (XANAX) 0.25 MG tablet Take 0.125 mg by mouth at bedtime as needed for anxiety.  . Ascorbic Acid (VITAMIN C PO) Take 1 tablet by mouth daily.  Marland Kitchen aspirin 81 MG tablet Take 81 mg by mouth daily.  . B-12,  Methylcobalamin, 1000 MCG SUBL Place 1 tablet under the tongue daily.  Marland Kitchen BETA CAROTENE PO daily.  Marland Kitchen BIOTIN PO Take 1 tablet by mouth daily.  . Cholecalciferol (VITAMIN D) 125 MCG (5000 UT) CAPS Take 5,000 Units by mouth 2 (two) times daily.  . CHROMIUM PO Take 1 tablet by mouth 2 (two) times daily.   . Coenzyme Q10 (COQ10) 100 MG CAPS Take 100 mg by mouth daily.  . L-Glutathione CRYS by Does not apply route.  Marland Kitchen losartan (COZAAR) 25 MG tablet Take 1 tablet (25 mg total) by mouth 2 (two) times daily.  . Multiple Vitamins-Minerals (MULTIVITAMIN PO) Take 1 tablet by mouth daily.  . Omega-3 Fatty Acids (FISH OIL PO) Take 1 capsule by mouth daily.  . ONE TOUCH ULTRA TEST test strip   . OVER THE COUNTER MEDICATION Take 1 capsule by mouth daily. Med Name: GYMNEMA SYLVERTRE LEAF  . OVER THE COUNTER MEDICATION Med Name: CalMax Mix one (1) packet into water to take by mouth daily at bedtime.  . propranolol (INDERAL) 20 MG tablet TAKE 1 TABLET BY MOUTH TWICE A DAY  . QUERCETIN PO Take by mouth 2 (two) times daily.  Marland Kitchen SYNTHROID 125 MCG tablet Take 125 mcg by mouth daily before breakfast.  . VANADYL SULFATE PO Take by mouth daily.  . Zinc 50 MG TABS Take by mouth daily.  . [DISCONTINUED] losartan (COZAAR) 50 MG tablet Take 50 mg by mouth daily. 1 tablet Daily    Allergies  Allergen Reactions  . Codeine Nausea And Vomiting    unspecified  . Cortisone Other (See Comments)    Turns face and skin red, and warm. Makes heart beat out of chest   . Erythromycin Other (See Comments)    Stomach cramps   . Losartan Potassium Other (See Comments)    Dizziness   . Sulfa Antibiotics Other (See Comments)    Unspecified.     SOCIAL HISTORY/FAMILY HISTORY   Reviewed in Epic:  Pertinent findings:  Social History   Tobacco Use  . Smoking status: Never Smoker  . Smokeless tobacco: Never Used  Vaping Use  . Vaping Use: Never used  Substance Use Topics  . Alcohol use: No  . Drug use: No   Social  History   Social History Narrative   She is a married mother of  1. She tries to walk every day, does not smoke and does not drink.      She has a significant family history coronary disease.    OBJCTIVE -PE, EKG, labs   Wt Readings from Last 3 Encounters:  08/14/20 239 lb (108.4 kg)  06/02/20 242 lb (109.8 kg)  04/15/19 247 lb (112 kg)    Physical Exam: BP 132/78   Pulse 63   Ht 5' 1.5" (1.562 m)   Wt 239 lb (108.4 kg)   SpO2 95%   BMI 44.43 kg/m  Physical Exam Vitals reviewed.  Constitutional:      General: She is not in acute distress (She is seems to be upset).    Appearance: She is not ill-appearing or toxic-appearing.     Comments: Morbidly obese.  Well-groomed.  HENT:     Head: Normocephalic and atraumatic.  Neck:     Vascular: No carotid bruit or JVD (Unable to assess).  Cardiovascular:     Rate and Rhythm: Normal rate and regular rhythm.  No extrasystoles are present.    Chest Wall: PMI is not displaced (Unable to palpate).     Pulses: Decreased pulses (Difficulty palpate due to body habitus).     Heart sounds: S1 normal and S2 normal. Heart sounds are distant. Murmur (Cannot exclude faint 1/6 SEM at RUSB.) heard.  No friction rub. No gallop.   Pulmonary:     Effort: Respiratory distress present.     Breath sounds: Normal breath sounds.  Chest:     Chest wall: Tenderness (Diffuse costochondral-bilateral) present.  Musculoskeletal:        General: Swelling (Trivial 1+ bilateral LE edema.  Has mild venous stasis changes.) present.     Cervical back: Normal range of motion and neck supple.  Neurological:     General: No focal deficit present.     Mental Status: She is alert and oriented to person, place, and time.     Gait: Gait abnormal (Unsteady, antalgic gait-hobbling limp because of knee pain.).  Psychiatric:     Comments: Somewhat depressed mood.  She is very anxious; becomes tearful when told about the Covid restrictions and how an active she is.   Searching for answer for her symptoms.   ...  Adult ECG Report  Rate: 63;  Rhythm: normal sinus rhythm and Nonspecific ST and T wave changes.  Otherwise normal axis, intervals, or durations;   Narrative Interpretation: Stable EKG.  Recent Labs: Reviewed.  Most recent lipids: 10/09/2019-TC 200, TG 186, HDL 54, LDL 114.  Last A1c 04/07/2020: 6.1%.;  TSH 0.18.  No results found for: CHOL, HDL, LDLCALC, LDLDIRECT, TRIG, CHOLHDL Lab Results  Component Value Date   CREATININE 0.54 (L) 06/02/2020   BUN 16 06/02/2020   NA 140 06/02/2020   K 4.7 06/02/2020   CL 101 06/02/2020   CO2 31 06/02/2020   CBC Latest Ref Rng & Units 06/02/2020 04/10/2019 07/13/2014  WBC 3.8 - 10.8 Thousand/uL 7.4 7.8 5.6  Hemoglobin 11.7 - 15.5 g/dL 29.5 28.4 13.2  Hematocrit 35.0 - 45.0 % 42.7 41.5 40.7  Platelets 140 - 400 Thousand/uL 343 319 295    No results found for: TSH  ==================================================  COVID-19 Education: The signs and symptoms of COVID-19 were discussed with the patient and how to seek care for testing (follow up with PCP or arrange E-visit).   The importance of social distancing and COVID-19 vaccination was discussed today. The patient is practicing social distancing & Masking.   I  spent a total of with the patient spent in direct patient consultation.  Rebecca Carey was in a state of mind in which she wanted to voice her complaints.  She had multiple questions and complaints we discussed.  We discussed medication issues and all of her symptoms.  We discussed Covid, and how this is affected her both socially and psychologically.  Additional time spent with chart review  / charting (studies, outside notes, etc): 14 min Total Time:   Current medicines are reviewed at length with the patient today.  (+/- concerns) concerned about side effects of losartan  This visit occurred during the SARS-CoV-2 public health emergency.  Safety protocols were in place,  including screening questions prior to the visit, additional usage of staff PPE, and extensive cleaning of exam room while observing appropriate contact time as indicated for disinfecting solutions.  Notice: This dictation was prepared with Dragon dictation along with smaller phrase technology. Any transcriptional errors that result from this process are unintentional and may not be corrected upon review.  Patient Instructions / Medication Changes & Studies & Tests Ordered   Patient Instructions  Medication Instructions:    losartan 25 mg twice a day   *If you need a refill on your cardiac medications before your next appointment, please call your pharmacy*   Lab Work: Not needed   Testing/Procedures: Not needed  Follow-Up: At [facility], you and your health needs are our priority.  As part of our continuing mission to provide you with exceptional heart care, we have created designated Provider Care Teams.  These Care Teams include your primary Cardiologist (physician) and Advanced Practice Providers (APPs -  Physician Assistants and Nurse Practitioners) who all work together to provide you with the care you need, when you need it.     Your next appointment:   12 month(s)  The format for your next appointment:   In Person  Provider:   Bryan Lemma, MD  Other  Try to walk with you can use a rolling walker or a cane     Studies Ordered:   Orders Placed This Encounter  Procedures  . EKG 12-Lead     Bryan Lemma, M.D., M.S. Interventional Cardiologist   Pager # [phone] Phone # [phone] [address] [place], [place] [address]   Thank you for choosing Heartcare at [facility]!!

## 2020-08-22 DIAGNOSIS — E039 Hypothyroidism, unspecified: Secondary | ICD-10-CM | POA: Diagnosis not present

## 2020-08-22 DIAGNOSIS — M1990 Unspecified osteoarthritis, unspecified site: Secondary | ICD-10-CM | POA: Diagnosis not present

## 2020-08-22 DIAGNOSIS — M8580 Other specified disorders of bone density and structure, unspecified site: Secondary | ICD-10-CM | POA: Diagnosis not present

## 2020-08-22 DIAGNOSIS — E782 Mixed hyperlipidemia: Secondary | ICD-10-CM | POA: Diagnosis not present

## 2020-08-22 DIAGNOSIS — I10 Essential (primary) hypertension: Secondary | ICD-10-CM | POA: Diagnosis not present

## 2020-08-22 DIAGNOSIS — E1169 Type 2 diabetes mellitus with other specified complication: Secondary | ICD-10-CM | POA: Diagnosis not present

## 2020-09-06 ENCOUNTER — Encounter: Payer: Self-pay | Admitting: Cardiology

## 2020-09-06 NOTE — Assessment & Plan Note (Signed)
Normal coronary CTA back in 2018.  Her chest pain still seems to be musculoskeletal in nature.  She has reproducible costochondritis symptoms on exam. => She is on a beta-blocker and ARB along with aspirin.  Reluctant to take anything for hyperlipidemia.  I talked about using as needed NSAIDs for chest pain.

## 2020-09-06 NOTE — Assessment & Plan Note (Signed)
She has lost some weight, but still has a BMI of 44.  She needs to get out do some walking.  With all of her myalgias I recommend that she walk with a cane or a walker.  This will help her with her balance.  I am just fearful of using a walker causing worsening costochondritis pain

## 2020-09-06 NOTE — Assessment & Plan Note (Addendum)
Remains off statin.  Has not had labs checked in a year.  LDL is 114.  Target should at least be less than 100.  Hopefully with weight loss this will improve.  She is taking fish oil.  Could consider Zetia or Nexletol-however with her current trend mind, I did not want to start any medication.

## 2020-09-06 NOTE — Assessment & Plan Note (Signed)
She indicates that she was not able to tolerate taking a diuretic because of overactive bladder.  Recommend foot elevation, and support stockings

## 2020-09-06 NOTE — Assessment & Plan Note (Signed)
Her blood pressure is well controlled, but she is very concerned about losartan.  I agreed to allow her to simply stop taking it.  She did not tolerate diuretic, and has not been on the 50 mg twice daily that I prescribed.  Plan for now be to back off losartan to 25 mg daily and reassess symptoms.

## 2020-09-06 NOTE — Assessment & Plan Note (Signed)
She has mild lower extremity edema, but pretty well controlled by  foot elevation.  No real PND or orthopnea.  There is diastolic function noted on echo, but she is truly only having class I true CHF symptoms.

## 2020-09-06 NOTE — Assessment & Plan Note (Signed)
If there is a diastolic dysfunction component to this, I would have expected to improve with increasing the ARB.  I think a lot of this is deconditioning, obesity and musculoskeletal pains with exercise.

## 2020-09-15 ENCOUNTER — Ambulatory Visit
Admission: RE | Admit: 2020-09-15 | Discharge: 2020-09-15 | Disposition: A | Discharge status: Home / Self Care | Payer: Medicare Other | Source: Ambulatory Visit | Attending: Internal Medicine | Admitting: Internal Medicine

## 2020-09-15 ENCOUNTER — Other Ambulatory Visit: Payer: Self-pay

## 2020-09-15 DIAGNOSIS — R109 Unspecified abdominal pain: Secondary | ICD-10-CM

## 2020-09-15 DIAGNOSIS — R1011 Right upper quadrant pain: Secondary | ICD-10-CM | POA: Diagnosis not present

## 2020-09-15 MED ORDER — GADOBENATE DIMEGLUMINE 529 MG/ML IV SOLN
20.0000 mL | Freq: Once | INTRAVENOUS | Status: AC | PRN
  Administered 2020-09-15: 20 mL via INTRAVENOUS

## 2020-09-25 ENCOUNTER — Other Ambulatory Visit: Payer: Self-pay | Admitting: Cardiology

## 2020-09-25 DIAGNOSIS — R0600 Dyspnea, unspecified: Secondary | ICD-10-CM

## 2020-09-25 DIAGNOSIS — R0609 Other forms of dyspnea: Secondary | ICD-10-CM

## 2020-09-25 DIAGNOSIS — I10 Essential (primary) hypertension: Secondary | ICD-10-CM

## 2020-10-13 ENCOUNTER — Other Ambulatory Visit: Payer: Self-pay | Admitting: Internal Medicine

## 2020-10-13 DIAGNOSIS — H8110 Benign paroxysmal vertigo, unspecified ear: Secondary | ICD-10-CM | POA: Diagnosis not present

## 2020-10-13 DIAGNOSIS — M8580 Other specified disorders of bone density and structure, unspecified site: Secondary | ICD-10-CM | POA: Diagnosis not present

## 2020-10-13 DIAGNOSIS — E039 Hypothyroidism, unspecified: Secondary | ICD-10-CM | POA: Diagnosis not present

## 2020-10-13 DIAGNOSIS — E1169 Type 2 diabetes mellitus with other specified complication: Secondary | ICD-10-CM | POA: Diagnosis not present

## 2020-10-13 DIAGNOSIS — E782 Mixed hyperlipidemia: Secondary | ICD-10-CM | POA: Diagnosis not present

## 2020-10-13 DIAGNOSIS — Z0000 Encounter for general adult medical examination without abnormal findings: Secondary | ICD-10-CM | POA: Diagnosis not present

## 2020-10-13 DIAGNOSIS — M8589 Other specified disorders of bone density and structure, multiple sites: Secondary | ICD-10-CM

## 2020-10-13 DIAGNOSIS — I10 Essential (primary) hypertension: Secondary | ICD-10-CM | POA: Diagnosis not present

## 2020-10-13 DIAGNOSIS — M3500 Sicca syndrome, unspecified: Secondary | ICD-10-CM | POA: Diagnosis not present

## 2020-10-13 DIAGNOSIS — Z1389 Encounter for screening for other disorder: Secondary | ICD-10-CM | POA: Diagnosis not present

## 2020-10-13 DIAGNOSIS — I700 Atherosclerosis of aorta: Secondary | ICD-10-CM | POA: Diagnosis not present

## 2020-10-14 ENCOUNTER — Other Ambulatory Visit: Payer: Self-pay | Admitting: Cardiology

## 2020-10-19 NOTE — Progress Notes (Deleted)
Office Visit Note  Patient: Rebecca Carey             Date of Birth: 12-01-1940           MRN: [id number]             PCP: Georgann Housekeeper, MD Referring: Georgann Housekeeper, MD Visit Date: 11/02/2020 Occupation: @GUAROCC @  Subjective:  No chief complaint on file.   History of Present Illness: Rebecca CREMEANS is a 80 y.o. female ***   Activities of Daily Living:  Patient reports morning stiffness for *** {minute/hour:19697}.   Patient {ACTIONS;DENIES/REPORTS:21021675::"Denies"} nocturnal pain.  Difficulty dressing/grooming: {ACTIONS;DENIES/REPORTS:21021675::"Denies"} Difficulty climbing stairs: {ACTIONS;DENIES/REPORTS:21021675::"Denies"} Difficulty getting out of chair: {ACTIONS;DENIES/REPORTS:21021675::"Denies"} Difficulty using hands for taps, buttons, cutlery, and/or writing: {ACTIONS;DENIES/REPORTS:21021675::"Denies"}  No Rheumatology ROS completed.   PMFS History:  Patient Active Problem List   Diagnosis Date Noted  . Diastolic dysfunction without heart failure 08/14/2020  . Pulmonary nodules 02/21/2017  . Exertional dyspnea 12/06/2016  . Chest pain with low risk for cardiac etiology 12/11/2012  . Edema of both legs 12/09/2012  . Morbid obesity with BMI of 40.0-44.9, adult (HCC)   . Essential hypertension   . Hyperlipidemia associated with type 2 diabetes mellitus (HCC)   . Diabetes mellitus type 2 in obese (HCC)   . Hypothyroidism     Past Medical History:  Diagnosis Date  . Diabetes mellitus type 2 in obese (HCC)    Using "herbal therapy"  . Dyslipidemia   . Fatty liver   . HTN (hypertension)   . Hypothyroidism   . Morbid obesity with BMI of 45.0-49.9, adult (HCC)    BMI ~45; 5'2" 242 lb  . Plantar fasciitis    per patient     Family History  Problem Relation Age of Onset  . Arthritis Mother   . Mitral valve prolapse Mother   . Hypothyroidism Mother   . Heart attack Father   . Diabetes Sister   . Diabetes Sister   . Arthritis Sister   . Heart  disease Sister   . COPD Sister   . Hypothyroidism Paternal Grandfather   . Hashimoto's thyroiditis Daughter    Past Surgical History:  Procedure Laterality Date  . ABDOMINAL HYSTERECTOMY    . CORONARY CALCIUM SCORE AND CARDIAC CT ANGIOGRAM     Coronary calcium score equals 29 (intermediate risk).  CT angiogram to CAD.  Only mild calcified plaque in the LEFT MAIN, and RCA  . NM MYOVIEW LTD  11/06/2012   EF 71%.  No ischemia or infarction.  Anterior defect consistent with breast attenuation  . TRANSTHORACIC ECHOCARDIOGRAM  09/20/2012   normal LV size and function with normal EF  . TRANSTHORACIC ECHOCARDIOGRAM  12/2016   Normal LV size and function with EF 55-60%. GR 2 DD. No visible valvular lesions.   Social History   Social History Narrative   She is a married mother of 1. She tries to walk every day, does not smoke and does not drink.      She has a significant family history coronary disease.    There is no immunization history on file for this patient.   Objective: Vital Signs: There were no vitals taken for this visit.   Physical Exam   Musculoskeletal Exam: ***  CDAI Exam: CDAI Score: -- Patient Global: --; Provider Global: -- Swollen: --; Tender: -- Joint Exam 11/02/2020   No joint exam has been documented for this visit   There is currently no information documented on the  homunculus. Go to the Rheumatology activity and complete the homunculus joint exam.  Investigation: No additional findings.  Imaging: No results found.  Recent Labs: Lab Results  Component Value Date   WBC 7.4 06/02/2020   HGB 14.1 06/02/2020   PLT 343 06/02/2020   NA 140 06/02/2020   K 4.7 06/02/2020   CL 101 06/02/2020   CO2 31 06/02/2020   GLUCOSE 96 06/02/2020   BUN 16 06/02/2020   CREATININE 0.54 (L) 06/02/2020   BILITOT 0.6 06/02/2020   ALKPHOS 66 07/13/2014   AST 18 06/02/2020   ALT 19 06/02/2020   PROT 7.0 06/02/2020   ALBUMIN 3.6 07/13/2014   CALCIUM 10.1  06/02/2020   GFRAA 106 06/02/2020    Speciality Comments: No specialty comments available.  Procedures:  No procedures performed Allergies: Codeine, Cortisone, Erythromycin, Losartan potassium, and Sulfa antibiotics   Assessment / Plan:     Visit Diagnoses: No diagnosis found.  Orders: No orders of the defined types were placed in this encounter.  No orders of the defined types were placed in this encounter.   Face-to-face time spent with patient was *** minutes. Greater than 50% of time was spent in counseling and coordination of care.  Follow-Up Instructions: No follow-ups on file.   Ellen Henri, CMA  Note - This record has been created using Animal nutritionist.  Chart creation errors have been sought, but may not always  have been located. Such creation errors do not reflect on  the standard of medical care.

## 2020-10-21 ENCOUNTER — Other Ambulatory Visit: Payer: Self-pay | Admitting: Internal Medicine

## 2020-10-21 DIAGNOSIS — Z1231 Encounter for screening mammogram for malignant neoplasm of breast: Secondary | ICD-10-CM

## 2020-10-22 MED ORDER — LOSARTAN POTASSIUM 50 MG PO TABS
50.0000 mg | ORAL_TABLET | Freq: Two times a day (BID) | ORAL | 3 refills | Status: AC
Start: 2020-10-22 — End: ?

## 2020-10-22 NOTE — Addendum Note (Signed)
Addended by: Hedda Slade on: 10/22/2020 08:52 AM   Modules accepted: Orders

## 2020-10-27 ENCOUNTER — Ambulatory Visit: Payer: Medicare Other | Admitting: Physician Assistant

## 2020-11-02 ENCOUNTER — Ambulatory Visit: Payer: Medicare Other | Admitting: Physician Assistant

## 2020-11-02 DIAGNOSIS — M19041 Primary osteoarthritis, right hand: Secondary | ICD-10-CM

## 2020-11-02 DIAGNOSIS — I10 Essential (primary) hypertension: Secondary | ICD-10-CM

## 2020-11-02 DIAGNOSIS — I341 Nonrheumatic mitral (valve) prolapse: Secondary | ICD-10-CM

## 2020-11-02 DIAGNOSIS — I5189 Other ill-defined heart diseases: Secondary | ICD-10-CM

## 2020-11-02 DIAGNOSIS — Z8639 Personal history of other endocrine, nutritional and metabolic disease: Secondary | ICD-10-CM

## 2020-11-02 DIAGNOSIS — M8580 Other specified disorders of bone density and structure, unspecified site: Secondary | ICD-10-CM

## 2020-11-02 DIAGNOSIS — E785 Hyperlipidemia, unspecified: Secondary | ICD-10-CM

## 2020-11-02 DIAGNOSIS — M3509 Sicca syndrome with other organ involvement: Secondary | ICD-10-CM

## 2020-11-02 DIAGNOSIS — H18519 Endothelial corneal dystrophy, unspecified eye: Secondary | ICD-10-CM

## 2020-11-02 DIAGNOSIS — M170 Bilateral primary osteoarthritis of knee: Secondary | ICD-10-CM

## 2020-11-02 DIAGNOSIS — M160 Bilateral primary osteoarthritis of hip: Secondary | ICD-10-CM

## 2020-11-02 DIAGNOSIS — M722 Plantar fascial fibromatosis: Secondary | ICD-10-CM

## 2020-11-02 DIAGNOSIS — M5136 Other intervertebral disc degeneration, lumbar region: Secondary | ICD-10-CM

## 2020-11-02 DIAGNOSIS — K449 Diaphragmatic hernia without obstruction or gangrene: Secondary | ICD-10-CM

## 2020-11-16 DIAGNOSIS — E039 Hypothyroidism, unspecified: Secondary | ICD-10-CM | POA: Diagnosis not present

## 2020-11-16 DIAGNOSIS — I10 Essential (primary) hypertension: Secondary | ICD-10-CM | POA: Diagnosis not present

## 2020-11-16 DIAGNOSIS — E782 Mixed hyperlipidemia: Secondary | ICD-10-CM | POA: Diagnosis not present

## 2020-11-16 DIAGNOSIS — M1990 Unspecified osteoarthritis, unspecified site: Secondary | ICD-10-CM | POA: Diagnosis not present

## 2020-11-16 DIAGNOSIS — E1169 Type 2 diabetes mellitus with other specified complication: Secondary | ICD-10-CM | POA: Diagnosis not present

## 2020-11-16 DIAGNOSIS — M8580 Other specified disorders of bone density and structure, unspecified site: Secondary | ICD-10-CM | POA: Diagnosis not present

## 2020-12-17 ENCOUNTER — Ambulatory Visit
Admission: RE | Admit: 2020-12-17 | Discharge: 2020-12-17 | Disposition: A | Discharge status: Home / Self Care | Payer: Medicare Other | Source: Ambulatory Visit | Attending: Internal Medicine | Admitting: Internal Medicine

## 2020-12-17 ENCOUNTER — Other Ambulatory Visit: Payer: Self-pay

## 2020-12-17 DIAGNOSIS — Z1231 Encounter for screening mammogram for malignant neoplasm of breast: Secondary | ICD-10-CM

## 2020-12-29 DIAGNOSIS — E039 Hypothyroidism, unspecified: Secondary | ICD-10-CM | POA: Diagnosis not present

## 2021-01-11 DIAGNOSIS — E1169 Type 2 diabetes mellitus with other specified complication: Secondary | ICD-10-CM | POA: Diagnosis not present

## 2021-01-11 DIAGNOSIS — I10 Essential (primary) hypertension: Secondary | ICD-10-CM | POA: Diagnosis not present

## 2021-01-11 DIAGNOSIS — I700 Atherosclerosis of aorta: Secondary | ICD-10-CM | POA: Diagnosis not present

## 2021-01-11 DIAGNOSIS — M3500 Sicca syndrome, unspecified: Secondary | ICD-10-CM | POA: Diagnosis not present

## 2021-01-11 DIAGNOSIS — E039 Hypothyroidism, unspecified: Secondary | ICD-10-CM | POA: Diagnosis not present

## 2021-02-10 DIAGNOSIS — I10 Essential (primary) hypertension: Secondary | ICD-10-CM | POA: Diagnosis not present

## 2021-02-10 DIAGNOSIS — M8580 Other specified disorders of bone density and structure, unspecified site: Secondary | ICD-10-CM | POA: Diagnosis not present

## 2021-02-10 DIAGNOSIS — M1990 Unspecified osteoarthritis, unspecified site: Secondary | ICD-10-CM | POA: Diagnosis not present

## 2021-02-10 DIAGNOSIS — E782 Mixed hyperlipidemia: Secondary | ICD-10-CM | POA: Diagnosis not present

## 2021-02-10 DIAGNOSIS — E039 Hypothyroidism, unspecified: Secondary | ICD-10-CM | POA: Diagnosis not present

## 2021-02-10 DIAGNOSIS — E1169 Type 2 diabetes mellitus with other specified complication: Secondary | ICD-10-CM | POA: Diagnosis not present

## 2021-02-26 ENCOUNTER — Telehealth: Payer: Self-pay

## 2021-02-26 NOTE — Telephone Encounter (Signed)
Patient left a voicemail stating she fell recently and hurt her right knee.  Patient states she would like to schedule an appointment with Dr. Corliss Skains ASAP so she can x-ray, as well as draw fluid off her knee.  Patient states she only wants to see Dr. Corliss Skains.

## 2021-02-26 NOTE — Telephone Encounter (Signed)
Attempted to contact the patient and left message for patient to call the office.  

## 2021-02-26 NOTE — Telephone Encounter (Signed)
Please advise her when raynauds phenomenon the next available appointment with me.  The options are that she may see Ladona Ridgel and  I can do the procedure and x-rays or I can see her on 03/08/21 as the last patient.

## 2021-03-01 NOTE — Telephone Encounter (Signed)
Attempted to contact the patient and left message for patient to call the office.  

## 2021-03-02 NOTE — Telephone Encounter (Signed)
Spoke with patient and scheduled her for 03/03/2021 at 10:30 am for evaluation.

## 2021-03-04 ENCOUNTER — Ambulatory Visit: Payer: Medicare Other | Admitting: Rheumatology

## 2021-04-06 ENCOUNTER — Other Ambulatory Visit: Payer: Medicare Other

## 2021-04-13 NOTE — Progress Notes (Deleted)
Office Visit Note  Patient: Rebecca Carey             Date of Birth: [birth year]           MRN: [id number]             PCP: Georgann Housekeeper, MD Referring: Georgann Housekeeper, MD Visit Date: 04/27/2021 Occupation: @GUAROCC @  Subjective:  No chief complaint on file.   History of Present Illness: Rebecca Carey is a 80 y.o. female ***   Activities of Daily Living:  Patient reports morning stiffness for *** {minute/hour:19697}.   Patient {ACTIONS;DENIES/REPORTS:21021675::"Denies"} nocturnal pain.  Difficulty dressing/grooming: {ACTIONS;DENIES/REPORTS:21021675::"Denies"} Difficulty climbing stairs: {ACTIONS;DENIES/REPORTS:21021675::"Denies"} Difficulty getting out of chair: {ACTIONS;DENIES/REPORTS:21021675::"Denies"} Difficulty using hands for taps, buttons, cutlery, and/or writing: {ACTIONS;DENIES/REPORTS:21021675::"Denies"}  No Rheumatology ROS completed.   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 08/14/2020   Pulmonary nodules 02/21/2017   Exertional dyspnea 12/06/2016   Chest pain with low risk for cardiac etiology 12/11/2012   Edema of both legs 12/09/2012   Morbid obesity with BMI of 40.0-44.9, adult (HCC)    Essential hypertension    Hyperlipidemia associated with type 2 diabetes mellitus (HCC)    Diabetes mellitus type 2 in obese (HCC)    Hypothyroidism     Past Medical History:  Diagnosis Date   Diabetes mellitus type 2 in obese (HCC)    Using "herbal therapy"   Dyslipidemia    Fatty liver    HTN (hypertension)    Hypothyroidism    Morbid obesity with BMI of 45.0-49.9, adult (HCC)    BMI ~45; 5'2" 242 lb   Plantar fasciitis    per patient     Family History  Problem Relation Age of Onset   Arthritis Mother    Mitral valve prolapse Mother    Hypothyroidism Mother    Heart attack Father    Diabetes Sister    Diabetes Sister    Arthritis Sister    Heart disease Sister    COPD  Sister    Hypothyroidism Paternal Grandfather    Hashimoto's thyroiditis Daughter    Past Surgical History:  Procedure Laterality Date   ABDOMINAL HYSTERECTOMY     CORONARY CALCIUM SCORE AND CARDIAC CT ANGIOGRAM     Coronary calcium score equals 29 (intermediate risk).  CT angiogram to CAD.  Only mild calcified plaque in the LEFT MAIN, and RCA   NM MYOVIEW LTD  11/06/2012   EF 71%.  No ischemia or infarction.  Anterior defect consistent with breast attenuation   TRANSTHORACIC ECHOCARDIOGRAM  09/20/2012   normal LV size and function with normal EF   TRANSTHORACIC ECHOCARDIOGRAM  12/2016   Normal LV size and function with EF 55-60%. GR 2 DD. No visible valvular lesions.   Social History   Social History Narrative   She is a married mother of 1. She tries to walk every day, does not smoke and does not drink.      She has a significant family history coronary disease.    There is no immunization history on file for this patient.   Objective: Vital Signs: There were no vitals taken for this visit.   Physical Exam   Musculoskeletal Exam: ***  CDAI Exam: CDAI Score: -- Patient Global: --; Provider Global: -- Swollen: --; Tender: -- Joint Exam 04/27/2021   No joint exam has been documented for this visit   There is currently no information documented on the  homunculus. Go to the Rheumatology activity and complete the homunculus joint exam.  Investigation: No additional findings.  Imaging: No results found.  Recent Labs: Lab Results  Component Value Date   WBC 7.4 06/02/2020   HGB 14.1 06/02/2020   PLT 343 06/02/2020   NA 140 06/02/2020   K 4.7 06/02/2020   CL 101 06/02/2020   CO2 31 06/02/2020   GLUCOSE 96 06/02/2020   BUN 16 06/02/2020   CREATININE 0.54 (L) 06/02/2020   BILITOT 0.6 06/02/2020   ALKPHOS 66 07/13/2014   AST 18 06/02/2020   ALT 19 06/02/2020   PROT 7.0 06/02/2020   ALBUMIN 3.6 07/13/2014   CALCIUM 10.1 06/02/2020   GFRAA 106 06/02/2020     Speciality Comments: No specialty comments available.  Procedures:  No procedures performed Allergies: Codeine, Cortisone, Erythromycin, Losartan potassium, and Sulfa antibiotics   Assessment / Plan:     Visit Diagnoses: Sjogren's syndrome with other organ involvement ([place])  Primary osteoarthritis of both hands  Primary osteoarthritis of both hips  Primary osteoarthritis of both knees  Plantar fasciitis of right foot  DDD (degenerative disc disease), lumbar  Osteopenia, unspecified location  Essential hypertension  History of hypothyroidism  Dyslipidemia  History of diabetes mellitus, type II  MVP (mitral valve prolapse)  Hiatal hernia  Diastolic dysfunction  Fuchs' corneal dystrophy, unspecified laterality  Orders: No orders of the defined types were placed in this encounter.  No orders of the defined types were placed in this encounter.   Face-to-face time spent with patient was *** minutes. Greater than 50% of time was spent in counseling and coordination of care.  Follow-Up Instructions: No follow-ups on file.   Ofilia Neas, PA-C  Note - This record has been created using Dragon software.  Chart creation errors have been sought, but may not always  have been located. Such creation errors do not reflect on  the standard of medical care.

## 2021-04-18 ENCOUNTER — Other Ambulatory Visit: Payer: Self-pay | Admitting: Cardiology

## 2021-04-23 DIAGNOSIS — M8580 Other specified disorders of bone density and structure, unspecified site: Secondary | ICD-10-CM | POA: Diagnosis not present

## 2021-04-23 DIAGNOSIS — E1169 Type 2 diabetes mellitus with other specified complication: Secondary | ICD-10-CM | POA: Diagnosis not present

## 2021-04-23 DIAGNOSIS — M1990 Unspecified osteoarthritis, unspecified site: Secondary | ICD-10-CM | POA: Diagnosis not present

## 2021-04-23 DIAGNOSIS — E039 Hypothyroidism, unspecified: Secondary | ICD-10-CM | POA: Diagnosis not present

## 2021-04-23 DIAGNOSIS — E782 Mixed hyperlipidemia: Secondary | ICD-10-CM | POA: Diagnosis not present

## 2021-04-23 DIAGNOSIS — I10 Essential (primary) hypertension: Secondary | ICD-10-CM | POA: Diagnosis not present

## 2021-04-27 ENCOUNTER — Ambulatory Visit: Payer: Medicare Other | Admitting: Rheumatology

## 2021-04-27 DIAGNOSIS — Z8639 Personal history of other endocrine, nutritional and metabolic disease: Secondary | ICD-10-CM

## 2021-04-27 DIAGNOSIS — M19041 Primary osteoarthritis, right hand: Secondary | ICD-10-CM

## 2021-04-27 DIAGNOSIS — M170 Bilateral primary osteoarthritis of knee: Secondary | ICD-10-CM

## 2021-04-27 DIAGNOSIS — H18519 Endothelial corneal dystrophy, unspecified eye: Secondary | ICD-10-CM

## 2021-04-27 DIAGNOSIS — M722 Plantar fascial fibromatosis: Secondary | ICD-10-CM

## 2021-04-27 DIAGNOSIS — I10 Essential (primary) hypertension: Secondary | ICD-10-CM

## 2021-04-27 DIAGNOSIS — K449 Diaphragmatic hernia without obstruction or gangrene: Secondary | ICD-10-CM

## 2021-04-27 DIAGNOSIS — I341 Nonrheumatic mitral (valve) prolapse: Secondary | ICD-10-CM

## 2021-04-27 DIAGNOSIS — M160 Bilateral primary osteoarthritis of hip: Secondary | ICD-10-CM

## 2021-04-27 DIAGNOSIS — M3509 Sicca syndrome with other organ involvement: Secondary | ICD-10-CM

## 2021-04-27 DIAGNOSIS — M5136 Other intervertebral disc degeneration, lumbar region: Secondary | ICD-10-CM

## 2021-04-27 DIAGNOSIS — E785 Hyperlipidemia, unspecified: Secondary | ICD-10-CM

## 2021-04-27 DIAGNOSIS — M8580 Other specified disorders of bone density and structure, unspecified site: Secondary | ICD-10-CM

## 2021-04-27 DIAGNOSIS — I5189 Other ill-defined heart diseases: Secondary | ICD-10-CM

## 2021-05-20 DIAGNOSIS — E119 Type 2 diabetes mellitus without complications: Secondary | ICD-10-CM | POA: Diagnosis not present

## 2021-05-20 DIAGNOSIS — H43811 Vitreous degeneration, right eye: Secondary | ICD-10-CM | POA: Diagnosis not present

## 2021-05-24 ENCOUNTER — Encounter: Payer: Self-pay | Admitting: Rheumatology

## 2021-05-24 ENCOUNTER — Ambulatory Visit: Payer: Medicare Other | Admitting: Rheumatology

## 2021-05-24 ENCOUNTER — Other Ambulatory Visit: Payer: Self-pay

## 2021-05-24 VITALS — BP 176/88 | HR 69 | Resp 16 | Ht 62.0 in | Wt 240.0 lb

## 2021-05-24 DIAGNOSIS — Z8639 Personal history of other endocrine, nutritional and metabolic disease: Secondary | ICD-10-CM

## 2021-05-24 DIAGNOSIS — I5189 Other ill-defined heart diseases: Secondary | ICD-10-CM

## 2021-05-24 DIAGNOSIS — I341 Nonrheumatic mitral (valve) prolapse: Secondary | ICD-10-CM

## 2021-05-24 DIAGNOSIS — E785 Hyperlipidemia, unspecified: Secondary | ICD-10-CM

## 2021-05-24 DIAGNOSIS — K449 Diaphragmatic hernia without obstruction or gangrene: Secondary | ICD-10-CM

## 2021-05-24 DIAGNOSIS — M5136 Other intervertebral disc degeneration, lumbar region: Secondary | ICD-10-CM | POA: Diagnosis not present

## 2021-05-24 DIAGNOSIS — M3509 Sicca syndrome with other organ involvement: Secondary | ICD-10-CM | POA: Diagnosis not present

## 2021-05-24 DIAGNOSIS — M8580 Other specified disorders of bone density and structure, unspecified site: Secondary | ICD-10-CM | POA: Diagnosis not present

## 2021-05-24 DIAGNOSIS — I10 Essential (primary) hypertension: Secondary | ICD-10-CM

## 2021-05-24 DIAGNOSIS — M160 Bilateral primary osteoarthritis of hip: Secondary | ICD-10-CM | POA: Diagnosis not present

## 2021-05-24 DIAGNOSIS — H18519 Endothelial corneal dystrophy, unspecified eye: Secondary | ICD-10-CM

## 2021-05-24 DIAGNOSIS — M170 Bilateral primary osteoarthritis of knee: Secondary | ICD-10-CM

## 2021-05-24 DIAGNOSIS — M19042 Primary osteoarthritis, left hand: Secondary | ICD-10-CM

## 2021-05-24 DIAGNOSIS — M19041 Primary osteoarthritis, right hand: Secondary | ICD-10-CM | POA: Diagnosis not present

## 2021-05-24 NOTE — Progress Notes (Signed)
Office Visit Note  Patient: Rebecca Carey             Date of Birth: [birth year]           MRN: [id number]             PCP: Georgann Housekeeper, MD Referring: Georgann Housekeeper, MD Visit Date: 05/24/2021 Occupation: @GUAROCC @  Subjective:  Right knee pain, dry mouth and dry eyes   History of Present Illness: Rebecca Carey is a 80 y.o. female with history of Sjogren's and osteoarthritis.  She states she continues to have dry mouth and dry eyes symptoms.  Her right knee joint has been very painful.  She states she recently fell and hurt her right knee joint.  She has been using a cane for mobility.  She states she had good response to Visco supplement injections in the past.  She is not sure if she wants to have repeat injection at this point.  She continues to have some discomfort in her hands and her hips.  Lower back pain persists.  Activities of Daily Living:  Patient reports morning stiffness for 24 hours.   Patient Reports nocturnal pain.  Difficulty dressing/grooming: Denies Difficulty climbing stairs: Reports Difficulty getting out of chair: Reports Difficulty using hands for taps, buttons, cutlery, and/or writing: Reports  Review of Systems  Constitutional:  Positive for fatigue. Negative for night sweats, weight gain and weight loss.  HENT:  Positive for mouth dryness. Negative for mouth sores, trouble swallowing, trouble swallowing and nose dryness.   Eyes:  Positive for dryness. Negative for pain, redness and visual disturbance.  Respiratory:  Positive for shortness of breath. Negative for cough and difficulty breathing.   Cardiovascular:  Positive for swelling in legs/feet. Negative for chest pain, palpitations, hypertension and irregular heartbeat.  Gastrointestinal:  Negative for blood in stool, constipation and diarrhea.  Endocrine: Positive for heat intolerance and increased urination.  Genitourinary:  Negative for difficulty urinating and vaginal dryness.   Musculoskeletal:  Positive for joint pain, gait problem, joint pain, joint swelling, muscle weakness and morning stiffness. Negative for myalgias, muscle tenderness and myalgias.  Skin:  Negative for color change, rash, hair loss, skin tightness, ulcers and sensitivity to sunlight.  Allergic/Immunologic: Positive for susceptible to infections.  Neurological:  Positive for dizziness, numbness and weakness. Negative for memory loss and night sweats.  Hematological:  Negative for bruising/bleeding tendency and swollen glands.  Psychiatric/Behavioral:  Positive for sleep disturbance. Negative for depressed mood. The patient is not nervous/anxious.    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 08/14/2020   Pulmonary nodules 02/21/2017   Exertional dyspnea 12/06/2016   Chest pain with low risk for cardiac etiology 12/11/2012   Edema of both legs 12/09/2012   Morbid obesity with BMI of 40.0-44.9, adult (HCC)    Essential hypertension    Hyperlipidemia associated with type 2 diabetes mellitus (HCC)    Diabetes mellitus type 2 in obese (HCC)    Hypothyroidism     Past Medical History:  Diagnosis Date   Diabetes mellitus type 2 in obese (HCC)    Using "herbal therapy"   Dyslipidemia    Fatty liver    HTN (hypertension)    Hypothyroidism    Morbid obesity with BMI of 45.0-49.9, adult (HCC)    BMI ~45; 5'2" 242 lb   Neuropathy    Plantar fasciitis    per patient    Sjogrens  syndrome ([place])     Family History  Problem Relation Age of Onset   Arthritis Mother    Mitral valve prolapse Mother    Hypothyroidism Mother    Heart attack Father    Diabetes Sister    Diabetes Sister    Arthritis Sister    Heart disease Sister    COPD Sister    Hypothyroidism Paternal Grandfather    Hashimoto's thyroiditis Daughter    Past Surgical History:  Procedure Laterality Date   ABDOMINAL HYSTERECTOMY     CORONARY CALCIUM SCORE  AND CARDIAC CT ANGIOGRAM     Coronary calcium score equals 29 (intermediate risk).  CT angiogram to CAD.  Only mild calcified plaque in the LEFT MAIN, and RCA   NM MYOVIEW LTD  11/06/2012   EF 71%.  No ischemia or infarction.  Anterior defect consistent with breast attenuation   TRANSTHORACIC ECHOCARDIOGRAM  09/20/2012   normal LV size and function with normal EF   TRANSTHORACIC ECHOCARDIOGRAM  12/2016   Normal LV size and function with EF 55-60%. GR 2 DD. No visible valvular lesions.   Social History   Social History Narrative   She is a married mother of 1. She tries to walk every day, does not smoke and does not drink.      She has a significant family history coronary disease.    There is no immunization history on file for this patient.   Objective: Vital Signs: BP (!) 176/88 (BP Location: Left Arm, Patient Position: Sitting, Cuff Size: Normal)   Pulse 69   Resp 16   Ht 5\' 2"  (1.575 m)   Wt 240 lb (108.9 kg)   BMI 43.90 kg/m    Physical Exam Vitals and nursing note reviewed.  Constitutional:      Appearance: She is well-developed.  HENT:     Head: Normocephalic and atraumatic.  Eyes:     Conjunctiva/sclera: Conjunctivae normal.  Cardiovascular:     Rate and Rhythm: Normal rate and regular rhythm.     Heart sounds: Normal heart sounds.  Pulmonary:     Effort: Pulmonary effort is normal.     Breath sounds: Normal breath sounds.  Abdominal:     General: Bowel sounds are normal.     Palpations: Abdomen is soft.  Musculoskeletal:     Cervical back: Normal range of motion.  Lymphadenopathy:     Cervical: No cervical adenopathy.  Skin:    General: Skin is warm and dry.     Capillary Refill: Capillary refill takes less than 2 seconds.  Neurological:     Mental Status: She is alert and oriented to person, place, and time.  Psychiatric:        Behavior: Behavior normal.     Musculoskeletal Exam: C-spine was in good range of motion.  Shoulder joints and elbow  joints in good range of motion.  She had DIP and PIP thickening in her hands but no synovitis.  Hip joints were difficult to assess in the chair.  She had discomfort range of motion of her right knee joint and some warmth on palpation due to recent fall.  Left knee joint was in good range of motion.  There was no tenderness over ankles or MTPs.  CDAI Exam: CDAI Score: -- Patient Global: --; Provider Global: -- Swollen: --; Tender: -- Joint Exam 05/24/2021   No joint exam has been documented for this visit   There is currently no information documented on the homunculus. Go to  the Rheumatology activity and complete the homunculus joint exam.  Investigation: No additional findings.  Imaging: No results found.  Recent Labs: Lab Results  Component Value Date   WBC 7.4 06/02/2020   HGB 14.1 06/02/2020   PLT 343 06/02/2020   NA 140 06/02/2020   K 4.7 06/02/2020   CL 101 06/02/2020   CO2 31 06/02/2020   GLUCOSE 96 06/02/2020   BUN 16 06/02/2020   CREATININE 0.54 (L) 06/02/2020   BILITOT 0.6 06/02/2020   ALKPHOS 66 07/13/2014   AST 18 06/02/2020   ALT 19 06/02/2020   PROT 7.0 06/02/2020   ALBUMIN 3.6 07/13/2014   CALCIUM 10.1 06/02/2020   GFRAA 106 06/02/2020    Speciality Comments: No specialty comments available.  Procedures:  No procedures performed Allergies: Codeine, Cortisone, Erythromycin, Losartan potassium, Norvasc [amlodipine], and Sulfa antibiotics   Assessment / Plan:     Visit Diagnoses: Sjogren's syndrome with other organ involvement (HCC) - +Ro, +La, +ANA -she continues to have dry mouth and dry eyes symptoms.  She states symptoms are manageable with over-the-counter products.  I will obtain following labs today.  Plan: CBC with Differential/Platelet, COMPLETE METABOLIC PANEL WITH GFR, Urinalysis, Routine w reflex microscopic, Sjogrens syndrome-A extractable nuclear antibody, Sjogrens syndrome-B extractable nuclear antibody, ANA, C3 and C4, Anti-DNA antibody,  double-stranded  Primary osteoarthritis of both hands-joint protection muscle strengthening was discussed.  Primary osteoarthritis of both hips-I could not assess hip joints in the sitting position.  Primary osteoarthritis of both knees -  X-rays show severe osteoarthritis and severe chondromalacia patella.  She continues to have pain and discomfort in her right knee joint.  She had good response to Visco supplement injections in the past.  She is not sure if she will get repeat Visco supplement injections.  She will contact us if she wants repeat injections.  DDD (degenerative disc disease), lumbar-she continues to have lower back pain.  She mobilizes with the help of a cane.  Osteopenia, unspecified location-use of calcium rich diet was discussed.  Essential hypertension-her blood pressure was elevated today.  She was advised to monitor blood pressure closely and follow-up with the PCP.  Other medical problems are listed as follows:  Dyslipidemia  History of diabetes mellitus, type II  MVP (mitral valve prolapse)  Hiatal hernia  Diastolic dysfunction  History of hypothyroidism  Fuchs' corneal dystrophy, unspecified laterality  Orders: Orders Placed This Encounter  Procedures   CBC with Differential/Platelet   COMPLETE METABOLIC PANEL WITH GFR   Urinalysis, Routine w reflex microscopic   Sjogrens syndrome-A extractable nuclear antibody   Sjogrens syndrome-B extractable nuclear antibody   ANA   C3 and C4   Anti-DNA antibody, double-stranded    No orders of the defined types were placed in this encounter.    Follow-Up Instructions: Return in about 1 year (around 05/24/2022), or if symptoms worsen or fail to improve, for Osteoarthritis, Sjogren's.   Pollyann Savoy, MD  Note - This record has been created using Animal nutritionist.  Chart creation errors have been sought, but may not always  have been located. Such creation errors do not reflect on  the standard of  medical care.

## 2021-05-27 LAB — ANTI-NUCLEAR AB-TITER (ANA TITER): ANA Titer 1: 1:40 {titer} — ABNORMAL HIGH

## 2021-05-27 LAB — SJOGRENS SYNDROME-A EXTRACTABLE NUCLEAR ANTIBODY: SSA (Ro) (ENA) Antibody, IgG: >8 AI — AB

## 2021-05-27 LAB — URINALYSIS, ROUTINE W REFLEX MICROSCOPIC
Bilirubin Urine: NEGATIVE
Glucose, UA: NEGATIVE
Hgb urine dipstick: NEGATIVE
Ketones, ur: NEGATIVE
Leukocytes,Ua: NEGATIVE
Nitrite: NEGATIVE
Protein, ur: NEGATIVE
Specific Gravity, Urine: 1.011 (ref 1.001–1.035)
pH: 7 (ref 5.0–8.0)

## 2021-05-27 LAB — COMPLETE METABOLIC PANEL WITH GFR
AG Ratio: 1.4 (calc) (ref 1.0–2.5)
ALT: 17 U/L (ref 6–29)
AST: 17 U/L (ref 10–35)
Albumin: 4.2 g/dL (ref 3.6–5.1)
Alkaline phosphatase (APISO): 83 U/L (ref 37–153)
BUN/Creatinine Ratio: 31 (calc) — ABNORMAL HIGH (ref 6–22)
BUN: 15 mg/dL (ref 7–25)
CO2: 29 mmol/L (ref 20–32)
Calcium: 9.9 mg/dL (ref 8.6–10.4)
Chloride: 102 mmol/L (ref 98–110)
Creat: 0.49 mg/dL — ABNORMAL LOW (ref 0.60–1.00)
Globulin: 3.1 g/dL (calc) (ref 1.9–3.7)
Glucose, Bld: 96 mg/dL (ref 65–99)
Potassium: 4.4 mmol/L (ref 3.5–5.3)
Sodium: 139 mmol/L (ref 135–146)
Total Bilirubin: 0.6 mg/dL (ref 0.2–1.2)
Total Protein: 7.3 g/dL (ref 6.1–8.1)
eGFR: 97 mL/min/{1.73_m2} (ref >=60)

## 2021-05-27 LAB — CBC WITH DIFFERENTIAL/PLATELET
Absolute Monocytes: 533 cells/uL (ref 200–950)
Basophils Absolute: 57 cells/uL (ref 0–200)
Basophils Relative: 0.7 %
Eosinophils Absolute: 205 cells/uL (ref 15–500)
Eosinophils Relative: 2.5 %
HCT: 43.3 % (ref 35.0–45.0)
Hemoglobin: 14.1 g/dL (ref 11.7–15.5)
Lymphs Abs: 1681 cells/uL (ref 850–3900)
MCH: 29 pg (ref 27.0–33.0)
MCHC: 32.6 g/dL (ref 32.0–36.0)
MCV: 89.1 fL (ref 80.0–100.0)
MPV: 10.8 fL (ref 7.5–12.5)
Monocytes Relative: 6.5 %
Neutro Abs: 5724 cells/uL (ref 1500–7800)
Neutrophils Relative %: 69.8 %
Platelets: 326 10*3/uL (ref 140–400)
RBC: 4.86 10*6/uL (ref 3.80–5.10)
RDW: 12.6 % (ref 11.0–15.0)
Total Lymphocyte: 20.5 %
WBC: 8.2 10*3/uL (ref 3.8–10.8)

## 2021-05-27 LAB — SJOGRENS SYNDROME-B EXTRACTABLE NUCLEAR ANTIBODY: SSB (La) (ENA) Antibody, IgG: 2.6 AI — AB

## 2021-05-27 LAB — ANA: Anti Nuclear Antibody (ANA): POSITIVE — AB

## 2021-05-27 LAB — C3 AND C4
C3 Complement: 128 mg/dL (ref 83–193)
C4 Complement: 29 mg/dL (ref 15–57)

## 2021-05-27 LAB — ANTI-DNA ANTIBODY, DOUBLE-STRANDED: ds DNA Ab: <1 IU/mL

## 2021-05-28 NOTE — Progress Notes (Signed)
CBC and CMP are normal.  UA is negative.  ANA, Ro and La antibodies (for Sjogren's) are positive and is stable.  Double-stranded ENA is negative.  Complements are normal.  Labs do not indicate active disease.  No change in treatment advised.

## 2021-09-16 ENCOUNTER — Other Ambulatory Visit: Payer: Self-pay | Admitting: Cardiology

## 2021-10-18 DIAGNOSIS — M1990 Unspecified osteoarthritis, unspecified site: Secondary | ICD-10-CM | POA: Diagnosis not present

## 2021-10-18 DIAGNOSIS — I700 Atherosclerosis of aorta: Secondary | ICD-10-CM | POA: Diagnosis not present

## 2021-10-18 DIAGNOSIS — E559 Vitamin D deficiency, unspecified: Secondary | ICD-10-CM | POA: Diagnosis not present

## 2021-10-18 DIAGNOSIS — E782 Mixed hyperlipidemia: Secondary | ICD-10-CM | POA: Diagnosis not present

## 2021-10-18 DIAGNOSIS — Z0000 Encounter for general adult medical examination without abnormal findings: Secondary | ICD-10-CM | POA: Diagnosis not present

## 2021-10-18 DIAGNOSIS — I10 Essential (primary) hypertension: Secondary | ICD-10-CM | POA: Diagnosis not present

## 2021-10-18 DIAGNOSIS — I519 Heart disease, unspecified: Secondary | ICD-10-CM | POA: Diagnosis not present

## 2021-10-18 DIAGNOSIS — E1169 Type 2 diabetes mellitus with other specified complication: Secondary | ICD-10-CM | POA: Diagnosis not present

## 2021-10-18 DIAGNOSIS — E039 Hypothyroidism, unspecified: Secondary | ICD-10-CM | POA: Diagnosis not present

## 2021-10-18 DIAGNOSIS — M3500 Sicca syndrome, unspecified: Secondary | ICD-10-CM | POA: Diagnosis not present

## 2021-10-18 DIAGNOSIS — M8580 Other specified disorders of bone density and structure, unspecified site: Secondary | ICD-10-CM | POA: Diagnosis not present

## 2021-10-25 ENCOUNTER — Other Ambulatory Visit: Payer: Self-pay | Admitting: Internal Medicine

## 2021-10-25 DIAGNOSIS — M8580 Other specified disorders of bone density and structure, unspecified site: Secondary | ICD-10-CM

## 2021-11-29 ENCOUNTER — Other Ambulatory Visit: Payer: Self-pay | Admitting: Internal Medicine

## 2021-11-29 DIAGNOSIS — Z1231 Encounter for screening mammogram for malignant neoplasm of breast: Secondary | ICD-10-CM

## 2021-12-21 ENCOUNTER — Ambulatory Visit
Admission: RE | Admit: 2021-12-21 | Discharge: 2021-12-21 | Disposition: A | Discharge status: Home / Self Care | Payer: Medicare Other | Source: Ambulatory Visit | Attending: Internal Medicine | Admitting: Internal Medicine

## 2021-12-21 DIAGNOSIS — Z1231 Encounter for screening mammogram for malignant neoplasm of breast: Secondary | ICD-10-CM | POA: Diagnosis not present

## 2022-01-05 ENCOUNTER — Telehealth: Payer: Self-pay | Admitting: Cardiology

## 2022-01-05 ENCOUNTER — Other Ambulatory Visit: Payer: Self-pay | Admitting: Cardiology

## 2022-01-05 NOTE — Telephone Encounter (Signed)
LMTCB

## 2022-01-05 NOTE — Telephone Encounter (Signed)
Patient states she has had several falls and she walks with a cane and has trouble getting on tables. She has been scheduled for 11/13 with Dr. Herbie Baltimore and she would like to know if there are electric/lifting chairs or equipment that can be used during her appointment. Please advise.

## 2022-01-07 MED ORDER — PROPRANOLOL HCL 20 MG PO TABS: 20.0000 mg | ORAL_TABLET | Freq: Two times a day (BID) | ORAL | 3 refills | Status: DC

## 2022-01-07 NOTE — Addendum Note (Signed)
Addended by: Freddi Starr on: 01/07/2022 09:09 AM   Modules accepted: Orders

## 2022-01-07 NOTE — Telephone Encounter (Addendum)
Spoke with pt, she is making sure her EKG can be done in the chair because she is unable to get on the table. Reassurance given to the patient that the EKG in the chair is just as good as lying on the table. Refills to last until her upcoming appointment sent to the pharmacy.

## 2022-04-19 NOTE — Progress Notes (Deleted)
Office Visit Note  Patient: Rebecca Carey             Date of Birth: Mar 13, 1941           MRN: [id number]             PCP: Wenda Low, MD Referring: Wenda Low, MD Visit Date: 05/03/2022 Occupation: @GUAROCC @  Subjective:  No chief complaint on file.   History of Present Illness: Rebecca Carey is a 81 y.o. female ***   Activities of Daily Living:  Patient reports morning stiffness for *** {minute/hour:19697}.   Patient {ACTIONS;DENIES/REPORTS:21021675::"Denies"} nocturnal pain.  Difficulty dressing/grooming: {ACTIONS;DENIES/REPORTS:21021675::"Denies"} Difficulty climbing stairs: {ACTIONS;DENIES/REPORTS:21021675::"Denies"} Difficulty getting out of chair: {ACTIONS;DENIES/REPORTS:21021675::"Denies"} Difficulty using hands for taps, buttons, cutlery, and/or writing: {ACTIONS;DENIES/REPORTS:21021675::"Denies"}  No Rheumatology ROS completed.   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 08/14/2020   Pulmonary nodules 02/21/2017   Exertional dyspnea 12/06/2016   Chest pain with low risk for cardiac etiology 12/11/2012   Edema of both legs 12/09/2012   Morbid obesity with BMI of 40.0-44.9, adult ([place])    Essential hypertension    Hyperlipidemia associated with type 2 diabetes mellitus ([place])    Diabetes mellitus type 2 in obese ([place])    Hypothyroidism     Past Medical History:  Diagnosis Date   Diabetes mellitus type 2 in obese ([place])    Using "herbal therapy"   Dyslipidemia    Fatty liver    HTN (hypertension)    Hypothyroidism    Morbid obesity with BMI of 45.0-49.9, adult (HCC)    BMI ~45; 5'2" 242 lb   Neuropathy    Plantar fasciitis    per patient    Sjogrens syndrome ([place])     Family History  Problem Relation Age of Onset   Arthritis Mother    Mitral valve prolapse Mother    Hypothyroidism Mother    Heart attack Father    Diabetes Sister    Diabetes Sister    Arthritis  Sister    Heart disease Sister    COPD Sister    Hypothyroidism Paternal Grandfather    Hashimoto's thyroiditis Daughter    Past Surgical History:  Procedure Laterality Date   ABDOMINAL HYSTERECTOMY     CORONARY CALCIUM SCORE AND CARDIAC CT ANGIOGRAM     Coronary calcium score equals 29 (intermediate risk).  CT angiogram to CAD.  Only mild calcified plaque in the LEFT MAIN, and RCA   NM MYOVIEW LTD  11/06/2012   EF 71%.  No ischemia or infarction.  Anterior defect consistent with breast attenuation   TRANSTHORACIC ECHOCARDIOGRAM  09/20/2012   normal LV size and function with normal EF   TRANSTHORACIC ECHOCARDIOGRAM  12/2016   Normal LV size and function with EF 55-60%. GR 2 DD. No visible valvular lesions.   Social History   Social History Narrative   She is a married mother of 1. She tries to walk every day, does not smoke and does not drink.      She has a significant family history coronary disease.    There is no immunization history on file for this patient.   Objective: Vital Signs: There were no vitals taken for this visit.   Physical Exam   Musculoskeletal Exam: ***  CDAI Exam: CDAI Score: -- Patient Global: --; Provider Global: -- Swollen: --; Tender: -- Joint Exam 05/03/2022   No joint exam has been documented for this visit  There is currently no information documented on the homunculus. Go to the Rheumatology activity and complete the homunculus joint exam.  Investigation: No additional findings.  Imaging: No results found.  Recent Labs: Lab Results  Component Value Date   WBC 8.2 05/24/2021   HGB 14.1 05/24/2021   PLT 326 05/24/2021   NA 139 05/24/2021   K 4.4 05/24/2021   CL 102 05/24/2021   CO2 29 05/24/2021   GLUCOSE 96 05/24/2021   BUN 15 05/24/2021   CREATININE 0.49 (L) 05/24/2021   BILITOT 0.6 05/24/2021   ALKPHOS 66 07/13/2014   AST 17 05/24/2021   ALT 17 05/24/2021   PROT 7.3 05/24/2021   ALBUMIN 3.6 07/13/2014   CALCIUM 9.9  05/24/2021   GFRAA 106 06/02/2020    Speciality Comments: No specialty comments available.  Procedures:  No procedures performed Allergies: Codeine, Cortisone, Erythromycin, Losartan potassium, Norvasc [amlodipine], and Sulfa antibiotics   Assessment / Plan:     Visit Diagnoses: No diagnosis found.  Orders: No orders of the defined types were placed in this encounter.  No orders of the defined types were placed in this encounter.   Face-to-face time spent with patient was *** minutes. Greater than 50% of time was spent in counseling and coordination of care.  Follow-Up Instructions: No follow-ups on file.   Ellen Henri, CMA  Note - This record has been created using Animal nutritionist.  Chart creation errors have been sought, but may not always  have been located. Such creation errors do not reflect on  the standard of medical care.

## 2022-04-25 ENCOUNTER — Ambulatory Visit: Payer: Medicare Other | Admitting: Cardiology

## 2022-04-25 DIAGNOSIS — E782 Mixed hyperlipidemia: Secondary | ICD-10-CM | POA: Diagnosis not present

## 2022-04-25 DIAGNOSIS — R202 Paresthesia of skin: Secondary | ICD-10-CM | POA: Diagnosis not present

## 2022-04-25 DIAGNOSIS — I700 Atherosclerosis of aorta: Secondary | ICD-10-CM | POA: Diagnosis not present

## 2022-04-25 DIAGNOSIS — E1169 Type 2 diabetes mellitus with other specified complication: Secondary | ICD-10-CM | POA: Diagnosis not present

## 2022-04-25 DIAGNOSIS — E039 Hypothyroidism, unspecified: Secondary | ICD-10-CM | POA: Diagnosis not present

## 2022-04-25 DIAGNOSIS — M3500 Sicca syndrome, unspecified: Secondary | ICD-10-CM | POA: Diagnosis not present

## 2022-04-25 DIAGNOSIS — M25512 Pain in left shoulder: Secondary | ICD-10-CM | POA: Diagnosis not present

## 2022-04-25 DIAGNOSIS — I519 Heart disease, unspecified: Secondary | ICD-10-CM | POA: Diagnosis not present

## 2022-04-25 DIAGNOSIS — Z23 Encounter for immunization: Secondary | ICD-10-CM | POA: Diagnosis not present

## 2022-04-25 DIAGNOSIS — I10 Essential (primary) hypertension: Secondary | ICD-10-CM | POA: Diagnosis not present

## 2022-05-02 DIAGNOSIS — J40 Bronchitis, not specified as acute or chronic: Secondary | ICD-10-CM | POA: Diagnosis not present

## 2022-05-02 DIAGNOSIS — E1169 Type 2 diabetes mellitus with other specified complication: Secondary | ICD-10-CM | POA: Diagnosis not present

## 2022-05-02 DIAGNOSIS — M3500 Sicca syndrome, unspecified: Secondary | ICD-10-CM | POA: Diagnosis not present

## 2022-05-03 ENCOUNTER — Ambulatory Visit: Payer: Medicare Other | Admitting: Rheumatology

## 2022-05-03 DIAGNOSIS — M3509 Sicca syndrome with other organ involvement: Secondary | ICD-10-CM

## 2022-05-03 DIAGNOSIS — H18519 Endothelial corneal dystrophy, unspecified eye: Secondary | ICD-10-CM

## 2022-05-03 DIAGNOSIS — M5136 Other intervertebral disc degeneration, lumbar region: Secondary | ICD-10-CM

## 2022-05-03 DIAGNOSIS — I10 Essential (primary) hypertension: Secondary | ICD-10-CM

## 2022-05-03 DIAGNOSIS — M19041 Primary osteoarthritis, right hand: Secondary | ICD-10-CM

## 2022-05-03 DIAGNOSIS — I341 Nonrheumatic mitral (valve) prolapse: Secondary | ICD-10-CM

## 2022-05-03 DIAGNOSIS — M170 Bilateral primary osteoarthritis of knee: Secondary | ICD-10-CM

## 2022-05-03 DIAGNOSIS — Z8639 Personal history of other endocrine, nutritional and metabolic disease: Secondary | ICD-10-CM

## 2022-05-03 DIAGNOSIS — E785 Hyperlipidemia, unspecified: Secondary | ICD-10-CM

## 2022-05-03 DIAGNOSIS — I5189 Other ill-defined heart diseases: Secondary | ICD-10-CM

## 2022-05-03 DIAGNOSIS — M8580 Other specified disorders of bone density and structure, unspecified site: Secondary | ICD-10-CM

## 2022-05-03 DIAGNOSIS — K449 Diaphragmatic hernia without obstruction or gangrene: Secondary | ICD-10-CM

## 2022-05-03 DIAGNOSIS — M160 Bilateral primary osteoarthritis of hip: Secondary | ICD-10-CM

## 2022-05-19 ENCOUNTER — Telehealth: Payer: Self-pay | Admitting: Cardiology

## 2022-05-19 NOTE — Telephone Encounter (Signed)
Spoke with patient of Dr. Herbie Baltimore  She is requesting a room with an electronic bed as she cannot get on the table for an exam. Advised we do not have electronic beds. She asked if EKG can be done in a chair and informed her that it can. She also states she would need to leave her shirt on as she has shoulder problems. She also requested a room near a bathroom as she has urgency. Explained we can assist her as needed but that I do not know where Dr. Herbie Baltimore will be assigned in the office that day.   Routed to [employer] as Lorain Childes

## 2022-05-19 NOTE — Telephone Encounter (Signed)
Pt would like a call back to discuss her upcoming appt with Dr. Herbie Baltimore. Requesting call back.

## 2022-05-24 ENCOUNTER — Encounter: Payer: Self-pay | Admitting: Cardiology

## 2022-05-24 ENCOUNTER — Ambulatory Visit: Payer: Medicare Other | Attending: Cardiology | Admitting: Cardiology

## 2022-05-24 VITALS — BP 138/88 | HR 66 | Ht 62.0 in | Wt 238.4 lb

## 2022-05-24 DIAGNOSIS — E785 Hyperlipidemia, unspecified: Secondary | ICD-10-CM

## 2022-05-24 DIAGNOSIS — Z6841 Body Mass Index (BMI) 40.0 and over, adult: Secondary | ICD-10-CM

## 2022-05-24 DIAGNOSIS — R079 Chest pain, unspecified: Secondary | ICD-10-CM | POA: Diagnosis not present

## 2022-05-24 DIAGNOSIS — R0609 Other forms of dyspnea: Secondary | ICD-10-CM | POA: Diagnosis not present

## 2022-05-24 DIAGNOSIS — R600 Localized edema: Secondary | ICD-10-CM

## 2022-05-24 DIAGNOSIS — I10 Essential (primary) hypertension: Secondary | ICD-10-CM | POA: Diagnosis not present

## 2022-05-24 DIAGNOSIS — E1169 Type 2 diabetes mellitus with other specified complication: Secondary | ICD-10-CM | POA: Diagnosis not present

## 2022-05-24 NOTE — Patient Instructions (Addendum)

## 2022-05-24 NOTE — Progress Notes (Signed)
Primary Care Provider: Georgann Housekeeper, MD [facility] Cardiologist: Bryan Lemma, MD Electrophysiologist: None  Clinic Note: Chief Complaint  Patient presents with   Follow-up    Delayed follow-up.  Almost 2 years.  Has been relatively stable.   ===================================  ASSESSMENT/PLAN   Problem List Items Addressed This Visit       Cardiology Problems   Hyperlipidemia associated with type 2 diabetes mellitus (HCC) (Chronic)    Labs checked in May showed LDL of 117.  She is almost 81 years old.  20 calcium score not that long ago was pretty low.  With all of her aches and pains, we decided to forego treating lipids.  LDL target should be less than 100 but, but is stable from couple years ago.  Currently 117.  She is very reluctant to try any medications therefore we will hold off on making adjustments.  Thankfully A1c was 6.1.  She is not on any medications.  Hopefully with continued diet changes her lipids and glycemic control be improved.      Essential hypertension (Chronic)    Blood pressure looks borderline controlled here today with diastolic pressure of 88.  She is now on 50 mg of the morning and 25 mg in the evening of losartan.  Continue to monitor, with anticipate potentially be able to increase further.      Relevant Orders   EKG 12-Lead (Completed)     Other   Morbid obesity with BMI of 40.0-44.9, adult (HCC) - Primary (Chronic)    The patient understands the need to lose weight with diet and exercise. We have discussed specific strategies for this.  I recommended in addition to dietary changes, consider water aerobics.      Relevant Orders   EKG 12-Lead (Completed)   Chest pain with low risk for cardiac etiology (Chronic)    Her chest pain is nonexertional.  Happens at rest and with stress.  More so she with palpitations.  She does have propranolol which seems to be keeping the palpitations at bay.  Overall her symptoms are pretty  well controlled.  No further changes.      Exertional dyspnea (Chronic)    Extremely deconditioned.  Limited by knee pain.  I think she is actually doing fairly well at this point.  Continue afterload reduction with ARB for blood pressure control.  She is reluctant to use diuretic and therefore no longer taking diuretic for mild lower extremity edema.      Edema of both legs (Chronic)   Relevant Orders   EKG 12-Lead (Completed)    ===================================  HPI:    Rebecca Carey is a morbidly obese 81 y.o. female with a PMH with HTN, HLD below who presents today for 23-month follow-up for chronic complaints of dyspnea, edema, MSK related chest pain.  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  Very upset and depressed because of the pandemic related restrictions.  Notably less active.  Not doing any exercise.  Off balance.  Knee hurts mostly on the right.  Was all concerned about different side effects of medications including muscle aches and pains.  She was concerned about losartan.  She was trying to place blame on medications for all of her random symptoms.  Worried about potassium levels.  Reluctant  to use a diuretic because of overactive bladder.  Very deconditioned to exertional dyspnea.  Stress related chest pain and palpitations. => Converted losartan 50 to 25 mg twice daily-this is increased to 50 mg in the morning and 25 mg in the evening.  Recent Hospitalizations: None  Reviewed  CV studies:    The following studies were reviewed today: (if available, images/films reviewed: From Epic Chart or Care Everywhere) None:  Interval History:   Rebecca Carey returns here today for follow-up overall doing okay.  She is happy that the COVID restrictions  have been lifted but unfortunately, she is now almost premature homebound because of significant knee pain.  She does not drive anymore, and her husband also does not really drive all that much.  He is borderline blind. She has been trying to do some walking uses a cane and still doing other strength conditioning exercises.  She is limited as far walking because of her knee pain.  She usually tries to walk in the driveway which she does get too far with the house. She has been trying to monitor her diet and adjust accordingly.  She does not notice any exertional chest pain when she does notice it is when she is under increased emotional stress and is often associated with palpitations.  Patient did not prolonged I did not lead to any syncope or near syncope.  No chest pain is usually fleeting as well.  CV Review of Symptoms (Summary): positive for - irregular heartbeat, palpitations, rapid heart rate, and these are noted above-minimal negative for - edema, loss of consciousness, orthopnea, paroxysmal nocturnal dyspnea, rapid heart rate, shortness of breath, or syncope or near syncope, TIA/amaurosis fugax or claudication.  REVIEWED OF SYSTEMS   Review of Systems  Constitutional:  Positive for malaise/fatigue (Energy level is better.  Trying to be more active but limited by right knee pain and back pain.). Negative for weight loss.  HENT:  Negative for congestion and nosebleeds.   Respiratory:  Positive for shortness of breath (Obese, deconditioned). Negative for cough.   Cardiovascular:  Positive for leg swelling.  Gastrointestinal:  Positive for blood in stool.  Genitourinary:  Positive for frequency (Overactive bladder). Negative for hematuria.  Musculoskeletal:  Positive for back pain, joint pain and myalgias.  Psychiatric/Behavioral:  Negative for depression (Seems to be in good spirits) and memory loss. Nervous/anxious: Notably less nervous and anxious today.    I have reviewed and (if  needed) personally updated the patient's problem list, medications, allergies, past medical and surgical history, social and family history.   PAST MEDICAL HISTORY   Past Medical History:  Diagnosis Date   Diabetes mellitus type 2 in obese (HCC)    Using "herbal therapy"   Dyslipidemia    Fatty liver    HTN (hypertension)    Hypothyroidism    Morbid obesity with BMI of 45.0-49.9, adult (HCC)    BMI ~45; 5'2" 242 lb   Neuropathy    Plantar fasciitis    per patient    Sjogrens syndrome (HCC)     PAST SURGICAL HISTORY   Past Surgical History:  Procedure Laterality Date   ABDOMINAL HYSTERECTOMY     CORONARY CALCIUM SCORE AND CARDIAC CT ANGIOGRAM     Coronary calcium score equals 29 (intermediate risk).  CT angiogram to CAD.  Only mild calcified plaque in the LEFT MAIN, and RCA   NM MYOVIEW LTD  11/06/2012   EF 71%.  No ischemia or infarction.  Anterior defect  consistent with breast attenuation   TRANSTHORACIC ECHOCARDIOGRAM  09/20/2012   normal LV size and function with normal EF   TRANSTHORACIC ECHOCARDIOGRAM  12/2016   Normal LV size and function with EF 55-60%. GR 2 DD. No visible valvular lesions.     There is no immunization history on file for this patient.  MEDICATIONS/ALLERGIES   Current Meds  Medication Sig   ALPHA LIPOIC ACID PO Take 1 tablet by mouth daily.    ALPRAZolam (XANAX) 0.25 MG tablet Take 0.125 mg by mouth at bedtime as needed for anxiety.   Ascorbic Acid (VITAMIN C PO) Take 1 tablet by mouth daily.   aspirin 81 MG tablet Take 81 mg by mouth daily.   B-12, Methylcobalamin, 1000 MCG SUBL Place 1 tablet under the tongue daily.   BETA CAROTENE PO daily.   BIOTIN PO Take 1 tablet by mouth daily.   Cholecalciferol (VITAMIN D) 125 MCG (5000 UT) CAPS Take 5,000 Units by mouth 2 (two) times daily.   CHROMIUM PO Take 1 tablet by mouth 2 (two) times daily.    Coenzyme Q10 (COQ10) 100 MG CAPS Take 100 mg by mouth daily.   Garlic 500 MG CAPS 1 capsule    L-Glutathione CRYS by Does not apply route.   losartan (COZAAR) 50 MG tablet Take 1 tablet (50 mg total) by mouth 2 (two) times daily. 50mg  in the am and 25mg  in the evening (Patient taking differently: Take 50 mg by mouth 2 (two) times daily. 50mg  in the am and 50mg  in the evening)   Multiple Vitamins-Minerals (MULTIVITAMIN PO) Take 1 tablet by mouth daily.   Omega-3 Fatty Acids (FISH OIL PO) Take 1 capsule by mouth daily.   ONE TOUCH ULTRA TEST test strip    OVER THE COUNTER MEDICATION Take 1 capsule by mouth daily. Med Name: GYMNEMA SYLVERTRE LEAF   OVER THE COUNTER MEDICATION Med Name: CalMax Mix one (1) packet into water to take by mouth daily at bedtime.   propranolol (INDERAL) 20 MG tablet Take 1 tablet (20 mg total) by mouth 2 (two) times daily.   QUERCETIN PO Take by mouth 2 (two) times daily.   SYNTHROID 125 MCG tablet Take 125 mcg by mouth daily before breakfast.   VANADYL SULFATE PO Take by mouth daily.   Zinc 50 MG TABS Take by mouth daily.    Allergies  Allergen Reactions   Codeine Nausea And Vomiting    unspecified Other reaction(s): stomach upset   Cortisone Other (See Comments)    Turns face and skin red, and warm. Makes heart beat out of chest    Erythromycin Other (See Comments)    Stomach cramps  Other reaction(s): stomach upset   Losartan Potassium Other (See Comments)    Dizziness  Other reaction(s): dizziness   Norvasc [Amlodipine]     Other reaction(s): leg edema   Sulfa Antibiotics Other (See Comments)    Unspecified.     SOCIAL HISTORY/FAMILY HISTORY   Reviewed in Epic:  Pertinent findings:  Social History   Tobacco Use   Smoking status: Never   Smokeless tobacco: Never  Vaping Use   Vaping Use: Never used  Substance Use Topics   Alcohol use: No   Drug use: No   Social History   Social History Narrative   She is a married mother of 1. She tries to walk every day, does not smoke and does not drink.      She has a significant family  history coronary  disease.    OBJCTIVE -PE, EKG, labs   Wt Readings from Last 3 Encounters:  05/24/22 238 lb 6.4 oz (108.1 kg)  05/24/21 240 lb (108.9 kg)  08/14/20 239 lb (108.4 kg)    Physical Exam: BP 138/88   Pulse 66   Ht 5\' 2"  (1.575 m)   Wt 238 lb 6.4 oz (108.1 kg)   SpO2 97%   BMI 43.60 kg/m  Physical Exam Vitals reviewed.  Constitutional:      Appearance: She is obese. She is not ill-appearing or toxic-appearing.     Comments: Morbidly obese.  Well-groomed.  HENT:     Head: Normocephalic and atraumatic.  Neck:     Vascular: No carotid bruit or JVD.  Cardiovascular:     Rate and Rhythm: Normal rate and regular rhythm. No extrasystoles are present.    Chest Wall: PMI is not displaced (Difficult to palpate due to body habitus).     Pulses: Decreased pulses (Difficult to palpate due to body habitus).     Heart sounds: S1 normal and S2 normal. Heart sounds are distant. Murmur (Cannot exclude soft ~1/6 SEM at RUSB.) heard.     No friction rub. No gallop.  Pulmonary:     Effort: Pulmonary effort is normal. No respiratory distress.     Breath sounds: Normal breath sounds. No wheezing, rhonchi or rales.  Chest:     Chest wall: Tenderness (Diffuse costochondral discomfort) present.  Musculoskeletal:        General: No swelling.     Cervical back: Neck supple.     Right lower leg: No edema.  Skin:    General: Skin is warm and dry.     Coloration: Skin is not jaundiced.  Neurological:     General: No focal deficit present.     Mental Status: She is alert and oriented to person, place, and time.     Gait: Gait abnormal.  Psychiatric:        Mood and Affect: Mood normal.        Behavior: Behavior normal.        Thought Content: Thought content normal.        Judgment: Judgment normal.     Comments: Rebecca Carey seems to be somewhat normal.     Adult ECG Report  Rate: 66 ;  Rhythm: normal sinus rhythm and nonspecific ST and T wave changes.  Otherwise normal axis,  intervals and durations. ;   Narrative Interpretation: Stable  Recent Labs:   10/18/2021: TC 206, TG 1.4, HDL 64, LDL 117.  Hgb 13.6. Cr 0.52, K+ 4.4. 05/12/2022: A1c 6.1, TSH 0.70 No results found for: "CHOL", "HDL", "LDLCALC", "LDLDIRECT", "TRIG", "CHOLHDL" Lab Results  Component Value Date   CREATININE 0.49 (L) 05/24/2021   BUN 15 05/24/2021   NA 139 05/24/2021   K 4.4 05/24/2021   CL 102 05/24/2021   CO2 29 05/24/2021      Latest Ref Rng & Units 05/24/2021   12:33 PM 06/02/2020   12:04 PM 04/10/2019    9:45 AM  CBC  WBC 3.8 - 10.8 Thousand/uL 8.2  7.4  7.8   Hemoglobin 11.7 - 15.5 g/dL 04/12/2019  83.2  91.9   Hematocrit 35.0 - 45.0 % 43.3  42.7  41.5   Platelets 140 - 400 Thousand/uL 326  343  319     No results found for: "HGBA1C" No results found for: "TSH"  ================================================== I spent a total of 26 minutes with the patient spent in  direct patient consultation.  Additional time spent with chart review  / charting (studies, outside notes, etc): 16 min Total Time: 42 min  Current medicines are reviewed at length with the patient today.  (+/- concerns) none  Notice: This dictation was prepared with Dragon dictation along with smart phrase technology. Any transcriptional errors that result from this process are unintentional and may not be corrected upon review.  Studies Ordered:   Orders Placed This Encounter  Procedures   EKG 12-Lead   No orders of the defined types were placed in this encounter.   Patient Instructions / Medication Changes & Studies & Tests Ordered   Patient Instructions  Medication Instructions:   No changes *If you need a refill on your cardiac medications before your next appointment, please call your pharmacy*   Lab Work: Not needed    Testing/Procedures:  Not needed  Follow-Up: At [facility], you and your health needs are our priority.  As part of our continuing mission to provide you with  exceptional heart care, we have created designated Provider Care Teams.  These Care Teams include your primary Cardiologist (physician) and Advanced Practice Providers (APPs -  Physician Assistants and Nurse Practitioners) who all work together to provide you with the care you need, when you need it.     Your next appointment:   12 month(s)  The format for your next appointment:   In Person  Provider:   Bryan Lemma, MD       Rebecca Lex, MD, MS Bryan Lemma, M.D., M.S. Interventional Cardiologist  [facility]  Pager # [phone] Phone # [phone] [address] [place], [place] [address]   Thank you for choosing [facility]!!

## 2022-06-06 ENCOUNTER — Other Ambulatory Visit: Payer: Self-pay | Admitting: Cardiology

## 2022-06-13 ENCOUNTER — Encounter: Payer: Self-pay | Admitting: Cardiology

## 2022-06-13 NOTE — Assessment & Plan Note (Signed)
Blood pressure looks borderline controlled here today with diastolic pressure of 88.  She is now on 50 mg of the morning and 25 mg in the evening of losartan.  Continue to monitor, with anticipate potentially be able to increase further.

## 2022-06-13 NOTE — Assessment & Plan Note (Signed)
Extremely deconditioned.  Limited by knee pain.  I think she is actually doing fairly well at this point.  Continue afterload reduction with ARB for blood pressure control.  She is reluctant to use diuretic and therefore no longer taking diuretic for mild lower extremity edema.

## 2022-06-13 NOTE — Assessment & Plan Note (Signed)
Labs checked in May showed LDL of 117.  She is almost 82 years old.  20 calcium score not that long ago was pretty low.  With all of her aches and pains, we decided to forego treating lipids.  LDL target should be less than 100 but, but is stable from couple years ago.  Currently 117.  She is very reluctant to try any medications therefore we will hold off on making adjustments.  Thankfully A1c was 6.1.  She is not on any medications.  Hopefully with continued diet changes her lipids and glycemic control be improved.

## 2022-06-13 NOTE — Assessment & Plan Note (Signed)
The patient understands the need to lose weight with diet and exercise. We have discussed specific strategies for this.  I recommended in addition to dietary changes, consider water aerobics.

## 2022-06-13 NOTE — Assessment & Plan Note (Signed)
Her chest pain is nonexertional.  Happens at rest and with stress.  More so she with palpitations.  She does have propranolol which seems to be keeping the palpitations at bay.  Overall her symptoms are pretty well controlled.  No further changes.

## 2022-06-30 DIAGNOSIS — H43811 Vitreous degeneration, right eye: Secondary | ICD-10-CM | POA: Diagnosis not present

## 2022-06-30 DIAGNOSIS — E119 Type 2 diabetes mellitus without complications: Secondary | ICD-10-CM | POA: Diagnosis not present

## 2022-07-18 ENCOUNTER — Telehealth: Payer: Self-pay | Admitting: Cardiology

## 2022-07-18 MED ORDER — PROPRANOLOL HCL 20 MG PO TABS: 20.0000 mg | ORAL_TABLET | Freq: Two times a day (BID) | ORAL | 3 refills | Status: DC

## 2022-07-18 NOTE — Telephone Encounter (Signed)
Left message on patient voicemail -  medication was sent to the pharmacy as requested

## 2022-07-18 NOTE — Telephone Encounter (Signed)
Pt c/o medication issue:  1. Name of Medication: propranolol (INDERAL) 20 MG tablet   2. How are you currently taking this medication (dosage and times per day)?   3. Are you having a reaction (difficulty breathing--STAT)?   4. What is your medication issue? Pt states that she would like a call back regarding prescription that she says was only called in for 30 days. She states that she is suppose to get 90 day supply and would like a call back to discuss where the mix up may have come from.

## 2022-10-25 DIAGNOSIS — I700 Atherosclerosis of aorta: Secondary | ICD-10-CM | POA: Diagnosis not present

## 2022-10-25 DIAGNOSIS — M3500 Sicca syndrome, unspecified: Secondary | ICD-10-CM | POA: Diagnosis not present

## 2022-10-25 DIAGNOSIS — R229 Localized swelling, mass and lump, unspecified: Secondary | ICD-10-CM | POA: Diagnosis not present

## 2022-10-25 DIAGNOSIS — E119 Type 2 diabetes mellitus without complications: Secondary | ICD-10-CM | POA: Diagnosis not present

## 2022-10-25 DIAGNOSIS — E1169 Type 2 diabetes mellitus with other specified complication: Secondary | ICD-10-CM | POA: Diagnosis not present

## 2022-10-25 DIAGNOSIS — I83893 Varicose veins of bilateral lower extremities with other complications: Secondary | ICD-10-CM | POA: Diagnosis not present

## 2022-10-25 DIAGNOSIS — M8580 Other specified disorders of bone density and structure, unspecified site: Secondary | ICD-10-CM | POA: Diagnosis not present

## 2022-10-25 DIAGNOSIS — E039 Hypothyroidism, unspecified: Secondary | ICD-10-CM | POA: Diagnosis not present

## 2022-10-25 DIAGNOSIS — Z0000 Encounter for general adult medical examination without abnormal findings: Secondary | ICD-10-CM | POA: Diagnosis not present

## 2022-10-25 DIAGNOSIS — I10 Essential (primary) hypertension: Secondary | ICD-10-CM | POA: Diagnosis not present

## 2022-10-25 DIAGNOSIS — E782 Mixed hyperlipidemia: Secondary | ICD-10-CM | POA: Diagnosis not present

## 2022-10-25 DIAGNOSIS — I519 Heart disease, unspecified: Secondary | ICD-10-CM | POA: Diagnosis not present

## 2022-11-02 DIAGNOSIS — R229 Localized swelling, mass and lump, unspecified: Secondary | ICD-10-CM | POA: Diagnosis not present

## 2022-11-02 DIAGNOSIS — R2232 Localized swelling, mass and lump, left upper limb: Secondary | ICD-10-CM | POA: Diagnosis not present

## 2022-11-04 ENCOUNTER — Other Ambulatory Visit: Payer: Self-pay | Admitting: Internal Medicine

## 2022-11-04 DIAGNOSIS — Z1231 Encounter for screening mammogram for malignant neoplasm of breast: Secondary | ICD-10-CM

## 2022-11-04 DIAGNOSIS — M8580 Other specified disorders of bone density and structure, unspecified site: Secondary | ICD-10-CM

## 2022-11-28 DIAGNOSIS — E039 Hypothyroidism, unspecified: Secondary | ICD-10-CM | POA: Diagnosis not present

## 2022-12-26 ENCOUNTER — Ambulatory Visit
Admission: RE | Admit: 2022-12-26 | Discharge: 2022-12-26 | Disposition: A | Discharge status: Home / Self Care | Payer: Medicare Other | Source: Ambulatory Visit | Attending: Internal Medicine | Admitting: Internal Medicine

## 2022-12-26 DIAGNOSIS — Z1231 Encounter for screening mammogram for malignant neoplasm of breast: Secondary | ICD-10-CM

## 2023-01-03 DIAGNOSIS — E039 Hypothyroidism, unspecified: Secondary | ICD-10-CM | POA: Diagnosis not present

## 2023-01-18 DIAGNOSIS — E039 Hypothyroidism, unspecified: Secondary | ICD-10-CM | POA: Diagnosis not present

## 2023-02-27 DIAGNOSIS — E039 Hypothyroidism, unspecified: Secondary | ICD-10-CM | POA: Diagnosis not present

## 2023-03-07 ENCOUNTER — Ambulatory Visit: Payer: Medicare Other | Admitting: Nurse Practitioner

## 2023-03-27 ENCOUNTER — Ambulatory Visit: Payer: Medicare Other | Attending: Nurse Practitioner | Admitting: Cardiology

## 2023-03-27 VITALS — BP 136/78 | HR 67 | Ht 62.0 in | Wt 230.2 lb

## 2023-03-27 DIAGNOSIS — R002 Palpitations: Secondary | ICD-10-CM

## 2023-03-27 DIAGNOSIS — E1169 Type 2 diabetes mellitus with other specified complication: Secondary | ICD-10-CM

## 2023-03-27 DIAGNOSIS — E039 Hypothyroidism, unspecified: Secondary | ICD-10-CM

## 2023-03-27 DIAGNOSIS — E785 Hyperlipidemia, unspecified: Secondary | ICD-10-CM

## 2023-03-27 DIAGNOSIS — I10 Essential (primary) hypertension: Secondary | ICD-10-CM | POA: Diagnosis not present

## 2023-03-27 DIAGNOSIS — R600 Localized edema: Secondary | ICD-10-CM

## 2023-03-27 DIAGNOSIS — Z6841 Body Mass Index (BMI) 40.0 and over, adult: Secondary | ICD-10-CM

## 2023-03-27 NOTE — Patient Instructions (Signed)
Medication Instructions:  No changes *If you need a refill on your cardiac medications before your next appointment, please call your pharmacy*   Lab Work: Not needed   Testing/Procedures:  Not needed Follow-Up: At [facility], you and your health needs are our priority.  As part of our continuing mission to provide you with exceptional heart care, we have created designated Provider Care Teams.  These Care Teams include your primary Cardiologist (physician) and Advanced Practice Providers (APPs -  Physician Assistants and Nurse Practitioners) who all work together to provide you with the care you need, when you need it.     Your next appointment:   12 month(s)  The format for your next appointment:   In Person  Provider:   Bryan Lemma, MD

## 2023-03-27 NOTE — Progress Notes (Unsigned)
Cardiology Office Note:  .   Date:  03/30/2023  ID:  Rebecca Carey, DOB Aug 04, 1940, MRN [id number] PCP: Georgann Housekeeper, MD  [facility] Providers Cardiologist:  Bryan Lemma, MD     Chief Complaint  Patient presents with   Follow-up    Annual.  Doing well.  No major complaints.    Patient Profile: .     Rebecca Carey is a morbidly obese 82 y.o. female  with a PMH notable for DM-2, HLD, HTN, chronic DOE and bilateral LE edema (NYHA class I-II HFpEF), as well as nonexertional chest pain (negative ischemic evaluation, CAC score of 31) who presents here for early annual follow-up at the request of Georgann Housekeeper, MD.   [name] was last seen on May 24, 2022 for almost 2-year follow-up.  Relatively stable.  LDL is 117 and A1c was 6.1.  She was reluctant to take medications for fear of side effects.  Discussed diet and exercise.  Blood pressures were also borderline taking Losartan split 50 mg a.m. and 25 mg p.m. nonexertional chest pain happening with stress and more associate with palpitations.  Propranolol tending to keep the palpitations at day.  Very deconditioned.  DOE felt to be multifactorial.  Edema stable..  Subjective  Discussed the use of AI scribe software for clinical note transcription with the patient, who gave verbal consent to proceed.  History of Present Illness   The patient, a 82 year old with a history of palpitations, hypertension, and thyroid disease, presents for a one-year follow-up. She reports a recent fall, which resulted in knee pain but no fractures. The patient uses a cane for balance due to neuropathy. She also reports occasional left ankle swelling, which she manages by elevating her feet.  The patient has been experiencing palpitations, which she attributes to recent changes in her thyroid medication. Her primary care provider had reduced her thyroid medication dosage due to high T4 levels, but this led to increased  palpitations and hair loss. The patient has since returned to her original dosage, which has improved her symptoms.  The patient also has hypertension, managed with losartan 50mg  twice daily. She reports that her blood pressure was high at a recent appointment with her primary care provider, but it had reduced by the end of the visit. The patient is reluctant to add another medication to her regimen.  The patient has an overactive bladder, which she initially attributed to Lasix. However, the symptoms persisted even after discontinuing the Lasix. The patient manages her overactive bladder symptoms without medication.  The patient maintains an active lifestyle, performing upper body exercises daily and walking around her house. She reports no shortness of breath or chest pain during these activities. She also reports no recent fevers, chills, colds, sweats, nausea, vomiting, loose stools, or constipation. The patient occasionally experiences blood in her stools, which she attributes to internal hemorrhoids.     Cardiovascular ROS: no chest pain or dyspnea on exertion positive for - orthopnea, palpitations, and rare - Palps notable with changing thyroids levels, minimal rare swelling in L LE negative for - edema, paroxysmal nocturnal dyspnea, rapid heart rate, shortness of breath, or lightheadedness, dizziness, syncope/near syncope, TIA/ amaurosis fugax/ CVA  ROS:  Review of Systems - Negative except walking limited by R knee  & L Lower Back pain; neck pain    Objective   Studies Reviewed: Marland Kitchen   EKG Interpretation Date/Time:  Monday March 27 2023 14:32:07 EDT Ventricular Rate:  67 PR Interval:  178 QRS Duration:  84 QT Interval:  394 QTC Calculation: 416 R Axis:   77  Text Interpretation: Normal sinus rhythm Nonspecific ST abnormality No previous ECGs available Confirmed by Bryan Lemma ([id number]) on 03/27/2023 2:53:45 PM    Results LABS(02/2023):  A1c: 6.1, LDL: 100,Triglycerides:  195,HDL: 61,Creatinine: 0.55,TSH: 0.7   Risk Assessment/Calculations:          Physical Exam:   VS:  BP 136/78   Pulse 67   Ht 5\' 2"  (1.575 m)   Wt 230 lb 3.2 oz (104.4 kg)   SpO2 97%   BMI 42.10 kg/m    Wt Readings from Last 3 Encounters:  03/27/23 230 lb 3.2 oz (104.4 kg)  05/24/22 238 lb 6.4 oz (108.1 kg)  05/24/21 240 lb (108.9 kg)    GEN: Well nourished, well developed in no acute distress; obese (but wgt loss noted) NECK: No JVD; No carotid bruits CARDIAC: Normal S1, S2; RRR, no murmurs, rubs, gallops RESPIRATORY:  Clear to auscultation without rales, wheezing or rhonchi ; nonlabored, good air movement. ABDOMEN: Soft, non-tender, non-distended EXTREMITIES:  No edema; No deformity     ASSESSMENT AND PLAN: .    Problem List Items Addressed This Visit       Cardiology Problems   Essential hypertension - Primary (Chronic)    Elevated blood pressure readings (initial BP today was 143/80).  Patient is currently on Losartan 50mg  twice daily. Discussed the possibility of adding Hydrochlorothiazide (HCTZ) if blood pressure continues to rise, but patient prefers not to add another medication at this time. -Monitor blood pressure and consider adding HCTZ if necessary.      Relevant Orders   EKG 12-Lead (Completed)   Hyperlipidemia associated with type 2 diabetes mellitus (HCC) (Chronic)    Not currently on any medications.  LDL is 100.  This is improving overall with the LDL previously 117.  Continue diet and exercise changes.  She is hoping to try to avoid medications because of side effect concerns.  A1c was 6.0.  Not currently on any diabetes meds.  Also try to avoid medicines.        Other   Edema of both legs (Chronic)    Predominating more left versus right ankle swelling.  Occasional swelling, particularly after certain dietary choices. No significant discomfort reported. -Advise patient to monitor and report if swelling worsens.      Hypothyroidism (Chronic)     Patient reports discomfort when thyroid medication dose is adjusted. Current TSH level is 0.7. -Continue current thyroid medication regimen and avoid unnecessary adjustments.      Morbid obesity with BMI of 40.0-44.9, adult (HCC) (Chronic)    Continues to need to work on diet and exercise to lose weight.  Discussed importance of finding some type of exercise and modify dietary intake.      Palpitations    Patient reports palpitations, particularly when thyroid medication is adjusted. Currently managed with Propranolol. -Continue Propranolol and maintain stable dose of thyroid medication to prevent palpitations.        Follow-up: Return in about 1 year (around 03/26/2024). Patient is agreeable to annual follow-up appointments. -Schedule next appointment in one year, or sooner if any issues arise.   Total time spent: 28 min spent with patient + 13 min spent charting = 41 min    Signed, Marykay Lex, MD, MS Bryan Lemma, M.D., M.S. Interventional Cardiologist  [facility]  Pager # [phone] Phone # [phone] [address] [place],  [place] [address]

## 2023-03-30 ENCOUNTER — Encounter: Payer: Self-pay | Admitting: Cardiology

## 2023-03-30 DIAGNOSIS — R002 Palpitations: Secondary | ICD-10-CM | POA: Insufficient documentation

## 2023-03-30 NOTE — Assessment & Plan Note (Signed)
Patient reports palpitations, particularly when thyroid medication is adjusted. Currently managed with Propranolol. -Continue Propranolol and maintain stable dose of thyroid medication to prevent palpitations.

## 2023-03-30 NOTE — Assessment & Plan Note (Signed)
Continues to need to work on diet and exercise to lose weight.  Discussed importance of finding some type of exercise and modify dietary intake.

## 2023-03-30 NOTE — Assessment & Plan Note (Signed)
Elevated blood pressure readings (initial BP today was 143/80).  Patient is currently on Losartan 50mg  twice daily. Discussed the possibility of adding Hydrochlorothiazide (HCTZ) if blood pressure continues to rise, but patient prefers not to add another medication at this time. -Monitor blood pressure and consider adding HCTZ if necessary.

## 2023-03-30 NOTE — Assessment & Plan Note (Addendum)
Not currently on any medications.  LDL is 100.  This is improving overall with the LDL previously 117.  Continue diet and exercise changes.  She is hoping to try to avoid medications because of side effect concerns.  A1c was 6.0.  Not currently on any diabetes meds.  Also try to avoid medicines.

## 2023-03-30 NOTE — Assessment & Plan Note (Signed)
Patient reports discomfort when thyroid medication dose is adjusted. Current TSH level is 0.7. -Continue current thyroid medication regimen and avoid unnecessary adjustments.

## 2023-03-30 NOTE — Assessment & Plan Note (Signed)
Predominating more left versus right ankle swelling.  Occasional swelling, particularly after certain dietary choices. No significant discomfort reported. -Advise patient to monitor and report if swelling worsens.

## 2023-04-28 DIAGNOSIS — E039 Hypothyroidism, unspecified: Secondary | ICD-10-CM | POA: Diagnosis not present

## 2023-04-28 DIAGNOSIS — M8580 Other specified disorders of bone density and structure, unspecified site: Secondary | ICD-10-CM | POA: Diagnosis not present

## 2023-04-28 DIAGNOSIS — I700 Atherosclerosis of aorta: Secondary | ICD-10-CM | POA: Diagnosis not present

## 2023-04-28 DIAGNOSIS — E1140 Type 2 diabetes mellitus with diabetic neuropathy, unspecified: Secondary | ICD-10-CM | POA: Diagnosis not present

## 2023-04-28 DIAGNOSIS — E782 Mixed hyperlipidemia: Secondary | ICD-10-CM | POA: Diagnosis not present

## 2023-04-28 DIAGNOSIS — M3500 Sicca syndrome, unspecified: Secondary | ICD-10-CM | POA: Diagnosis not present

## 2023-04-28 DIAGNOSIS — I10 Essential (primary) hypertension: Secondary | ICD-10-CM | POA: Diagnosis not present

## 2023-04-28 DIAGNOSIS — I519 Heart disease, unspecified: Secondary | ICD-10-CM | POA: Diagnosis not present

## 2023-05-29 ENCOUNTER — Other Ambulatory Visit: Payer: Medicare Other

## 2023-07-06 DIAGNOSIS — H43811 Vitreous degeneration, right eye: Secondary | ICD-10-CM | POA: Diagnosis not present

## 2023-07-06 DIAGNOSIS — H524 Presbyopia: Secondary | ICD-10-CM | POA: Diagnosis not present

## 2023-07-06 DIAGNOSIS — E119 Type 2 diabetes mellitus without complications: Secondary | ICD-10-CM | POA: Diagnosis not present

## 2023-08-11 ENCOUNTER — Other Ambulatory Visit: Payer: Self-pay | Admitting: Cardiology

## 2023-11-23 DIAGNOSIS — Z0000 Encounter for general adult medical examination without abnormal findings: Secondary | ICD-10-CM | POA: Diagnosis not present

## 2023-11-23 DIAGNOSIS — E039 Hypothyroidism, unspecified: Secondary | ICD-10-CM | POA: Diagnosis not present

## 2023-11-23 DIAGNOSIS — E782 Mixed hyperlipidemia: Secondary | ICD-10-CM | POA: Diagnosis not present

## 2023-11-23 DIAGNOSIS — M8580 Other specified disorders of bone density and structure, unspecified site: Secondary | ICD-10-CM | POA: Diagnosis not present

## 2023-11-23 DIAGNOSIS — E1140 Type 2 diabetes mellitus with diabetic neuropathy, unspecified: Secondary | ICD-10-CM | POA: Diagnosis not present

## 2023-11-23 DIAGNOSIS — I519 Heart disease, unspecified: Secondary | ICD-10-CM | POA: Diagnosis not present

## 2023-11-23 DIAGNOSIS — I700 Atherosclerosis of aorta: Secondary | ICD-10-CM | POA: Diagnosis not present

## 2023-11-23 DIAGNOSIS — M3500 Sicca syndrome, unspecified: Secondary | ICD-10-CM | POA: Diagnosis not present

## 2023-11-23 DIAGNOSIS — I10 Essential (primary) hypertension: Secondary | ICD-10-CM | POA: Diagnosis not present

## 2024-03-27 ENCOUNTER — Other Ambulatory Visit: Payer: Self-pay | Admitting: Internal Medicine

## 2024-03-27 DIAGNOSIS — Z1231 Encounter for screening mammogram for malignant neoplasm of breast: Secondary | ICD-10-CM

## 2024-04-03 ENCOUNTER — Ambulatory Visit
Admission: RE | Admit: 2024-04-03 | Discharge: 2024-04-03 | Disposition: A | Source: Ambulatory Visit | Attending: Internal Medicine | Admitting: Internal Medicine

## 2024-04-03 DIAGNOSIS — Z1231 Encounter for screening mammogram for malignant neoplasm of breast: Secondary | ICD-10-CM

## 2024-04-23 ENCOUNTER — Other Ambulatory Visit: Payer: Self-pay | Admitting: Cardiology

## 2024-04-23 MED ORDER — PROPRANOLOL HCL 20 MG PO TABS: 20.0000 mg | ORAL_TABLET | Freq: Two times a day (BID) | ORAL | 0 refills | Status: DC

## 2024-05-24 ENCOUNTER — Other Ambulatory Visit: Payer: Self-pay | Admitting: Cardiology
